# Patient Record
Sex: Male | Born: 1935 | Race: White | Hispanic: No | Marital: Married | Smoking: Former smoker
Health system: Southern US, Community
[De-identification: ages and names within clinical notes are randomized; demographics above are authoritative.]

## PROBLEM LIST (undated history)

## (undated) DIAGNOSIS — C801 Malignant (primary) neoplasm, unspecified: Secondary | ICD-10-CM

## (undated) DIAGNOSIS — M1990 Unspecified osteoarthritis, unspecified site: Secondary | ICD-10-CM

## (undated) DIAGNOSIS — N400 Enlarged prostate without lower urinary tract symptoms: Secondary | ICD-10-CM

## (undated) DIAGNOSIS — E119 Type 2 diabetes mellitus without complications: Secondary | ICD-10-CM

## (undated) HISTORY — PX: CHOLECYSTECTOMY: SHX55

---

## 2016-04-09 ENCOUNTER — Encounter (INDEPENDENT_AMBULATORY_CARE_PROVIDER_SITE_OTHER): Payer: Self-pay | Admitting: Ophthalmology

## 2016-05-10 ENCOUNTER — Encounter (INDEPENDENT_AMBULATORY_CARE_PROVIDER_SITE_OTHER): Payer: Self-pay | Admitting: Ophthalmology

## 2019-03-09 ENCOUNTER — Other Ambulatory Visit: Payer: Self-pay

## 2019-03-09 ENCOUNTER — Encounter (INDEPENDENT_AMBULATORY_CARE_PROVIDER_SITE_OTHER): Payer: Medicare HMO | Admitting: Ophthalmology

## 2019-03-09 DIAGNOSIS — H43813 Vitreous degeneration, bilateral: Secondary | ICD-10-CM

## 2019-03-09 DIAGNOSIS — E11311 Type 2 diabetes mellitus with unspecified diabetic retinopathy with macular edema: Secondary | ICD-10-CM

## 2019-03-09 DIAGNOSIS — E113291 Type 2 diabetes mellitus with mild nonproliferative diabetic retinopathy without macular edema, right eye: Secondary | ICD-10-CM

## 2019-03-09 DIAGNOSIS — E113312 Type 2 diabetes mellitus with moderate nonproliferative diabetic retinopathy with macular edema, left eye: Secondary | ICD-10-CM

## 2019-03-09 DIAGNOSIS — H353122 Nonexudative age-related macular degeneration, left eye, intermediate dry stage: Secondary | ICD-10-CM | POA: Diagnosis not present

## 2019-04-06 ENCOUNTER — Encounter (INDEPENDENT_AMBULATORY_CARE_PROVIDER_SITE_OTHER): Payer: Medicare HMO | Admitting: Ophthalmology

## 2019-04-06 DIAGNOSIS — H43813 Vitreous degeneration, bilateral: Secondary | ICD-10-CM

## 2019-04-06 DIAGNOSIS — E113312 Type 2 diabetes mellitus with moderate nonproliferative diabetic retinopathy with macular edema, left eye: Secondary | ICD-10-CM

## 2019-04-06 DIAGNOSIS — E113291 Type 2 diabetes mellitus with mild nonproliferative diabetic retinopathy without macular edema, right eye: Secondary | ICD-10-CM

## 2019-04-06 DIAGNOSIS — E11311 Type 2 diabetes mellitus with unspecified diabetic retinopathy with macular edema: Secondary | ICD-10-CM

## 2019-05-04 ENCOUNTER — Encounter (INDEPENDENT_AMBULATORY_CARE_PROVIDER_SITE_OTHER): Payer: Medicare HMO | Admitting: Ophthalmology

## 2019-05-10 ENCOUNTER — Emergency Department (HOSPITAL_COMMUNITY)
Admission: EM | Admit: 2019-05-10 | Discharge: 2019-05-10 | Disposition: A | Discharge status: Home / Self Care | Payer: Medicare HMO | Attending: Emergency Medicine | Admitting: Emergency Medicine

## 2019-05-10 ENCOUNTER — Emergency Department (HOSPITAL_COMMUNITY): Payer: Medicare HMO

## 2019-05-10 ENCOUNTER — Other Ambulatory Visit: Payer: Self-pay

## 2019-05-10 ENCOUNTER — Encounter (HOSPITAL_COMMUNITY): Payer: Self-pay | Admitting: *Deleted

## 2019-05-10 DIAGNOSIS — Z20822 Contact with and (suspected) exposure to covid-19: Secondary | ICD-10-CM | POA: Diagnosis not present

## 2019-05-10 DIAGNOSIS — R109 Unspecified abdominal pain: Secondary | ICD-10-CM | POA: Diagnosis not present

## 2019-05-10 DIAGNOSIS — M791 Myalgia, unspecified site: Secondary | ICD-10-CM | POA: Diagnosis present

## 2019-05-10 DIAGNOSIS — E119 Type 2 diabetes mellitus without complications: Secondary | ICD-10-CM | POA: Insufficient documentation

## 2019-05-10 DIAGNOSIS — Z87891 Personal history of nicotine dependence: Secondary | ICD-10-CM | POA: Diagnosis not present

## 2019-05-10 DIAGNOSIS — C799 Secondary malignant neoplasm of unspecified site: Secondary | ICD-10-CM

## 2019-05-10 DIAGNOSIS — R918 Other nonspecific abnormal finding of lung field: Secondary | ICD-10-CM | POA: Insufficient documentation

## 2019-05-10 DIAGNOSIS — I4891 Unspecified atrial fibrillation: Secondary | ICD-10-CM

## 2019-05-10 HISTORY — DX: Benign prostatic hyperplasia without lower urinary tract symptoms: N40.0

## 2019-05-10 HISTORY — DX: Unspecified osteoarthritis, unspecified site: M19.90

## 2019-05-10 HISTORY — DX: Type 2 diabetes mellitus without complications: E11.9

## 2019-05-10 LAB — COMPREHENSIVE METABOLIC PANEL
ALT: 12 U/L (ref 0–44)
AST: 17 U/L (ref 15–41)
Albumin: 3.4 g/dL — ABNORMAL LOW (ref 3.5–5.0)
Alkaline Phosphatase: 92 U/L (ref 38–126)
Anion gap: 10 (ref 5–15)
BUN: 8 mg/dL (ref 8–23)
CO2: 26 mmol/L (ref 22–32)
Calcium: 9.2 mg/dL (ref 8.9–10.3)
Chloride: 100 mmol/L (ref 98–111)
Creatinine, Ser: 0.49 mg/dL — ABNORMAL LOW (ref 0.61–1.24)
GFR calc Af Amer: >60 mL/min (ref >60)
GFR calc non Af Amer: >60 mL/min (ref >60)
Glucose, Bld: 130 mg/dL — ABNORMAL HIGH (ref 70–99)
Potassium: 3.3 mmol/L — ABNORMAL LOW (ref 3.5–5.1)
Sodium: 136 mmol/L (ref 135–145)
Total Bilirubin: 0.7 mg/dL (ref 0.3–1.2)
Total Protein: 7.3 g/dL (ref 6.5–8.1)

## 2019-05-10 LAB — URINALYSIS, ROUTINE W REFLEX MICROSCOPIC
Bilirubin Urine: NEGATIVE
Glucose, UA: NEGATIVE mg/dL
Hgb urine dipstick: NEGATIVE
Ketones, ur: NEGATIVE mg/dL
Leukocytes,Ua: NEGATIVE
Nitrite: NEGATIVE
Protein, ur: NEGATIVE mg/dL
Specific Gravity, Urine: 1.005 (ref 1.005–1.030)
pH: 6 (ref 5.0–8.0)

## 2019-05-10 LAB — CBC WITH DIFFERENTIAL/PLATELET
Abs Immature Granulocytes: 0.01 10*3/uL (ref 0.00–0.07)
Basophils Absolute: 0.1 10*3/uL (ref 0.0–0.1)
Basophils Relative: 1 %
Eosinophils Absolute: 0 10*3/uL (ref 0.0–0.5)
Eosinophils Relative: 1 %
HCT: 46.6 % (ref 39.0–52.0)
Hemoglobin: 14.8 g/dL (ref 13.0–17.0)
Immature Granulocytes: 0 %
Lymphocytes Relative: 13 %
Lymphs Abs: 1 10*3/uL (ref 0.7–4.0)
MCH: 28.6 pg (ref 26.0–34.0)
MCHC: 31.8 g/dL (ref 30.0–36.0)
MCV: 90.1 fL (ref 80.0–100.0)
Monocytes Absolute: 0.5 10*3/uL (ref 0.1–1.0)
Monocytes Relative: 6 %
Neutro Abs: 6.3 10*3/uL (ref 1.7–7.7)
Neutrophils Relative %: 79 %
Platelets: 420 10*3/uL — ABNORMAL HIGH (ref 150–400)
RBC: 5.17 MIL/uL (ref 4.22–5.81)
RDW: 14.1 % (ref 11.5–15.5)
WBC: 7.9 10*3/uL (ref 4.0–10.5)
nRBC: 0 % (ref 0.0–0.2)

## 2019-05-10 LAB — TSH: TSH: 0.445 u[IU]/mL (ref 0.350–4.500)

## 2019-05-10 LAB — MAGNESIUM: Magnesium: 1.6 mg/dL — ABNORMAL LOW (ref 1.7–2.4)

## 2019-05-10 LAB — RESPIRATORY PANEL BY RT PCR (FLU A&B, COVID)
Influenza A by PCR: NEGATIVE
Influenza B by PCR: NEGATIVE
SARS Coronavirus 2 by RT PCR: NEGATIVE

## 2019-05-10 LAB — C-REACTIVE PROTEIN: CRP: 2.5 mg/dL — ABNORMAL HIGH (ref <1.0)

## 2019-05-10 LAB — DIGOXIN LEVEL: Digoxin Level: <0.2 ng/mL — ABNORMAL LOW (ref 0.8–2.0)

## 2019-05-10 LAB — SEDIMENTATION RATE: Sed Rate: 38 mm/hr — ABNORMAL HIGH (ref 0–16)

## 2019-05-10 LAB — TROPONIN I (HIGH SENSITIVITY)
Troponin I (High Sensitivity): 5 ng/L (ref <18)
Troponin I (High Sensitivity): 4 ng/L (ref <18)

## 2019-05-10 LAB — CK: Total CK: 31 U/L — ABNORMAL LOW (ref 49–397)

## 2019-05-10 IMAGING — CT CT ABD-PELV W/ CM
2 of 5 series · 15 of 46 positions shown, 17 images · IV contrast (Omnipaque or Isovue)
Comparison: [DATE], [DATE]

CLINICAL DATA: Generalized body pain, abnormal chest x-ray

EXAM:
CT ABDOMEN AND PELVIS WITH CONTRAST
TECHNIQUE: Multidetector CT imaging of the abdomen and pelvis was performed
using the standard protocol following bolus administration of
intravenous contrast.
CONTRAST:  100mL OMNIPAQUE IOHEXOL 350 MG/ML SOLN

[Series 10: abd/pel w/ · axial · 0.80mm/px · z∈[+718,+1098]mm · 12 of 90 slices shown, 14 images]
[im 7/90  soft-tissue]
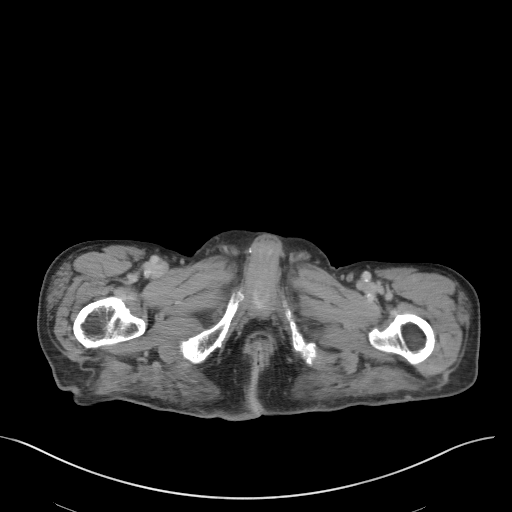
[im 7/90  bone]
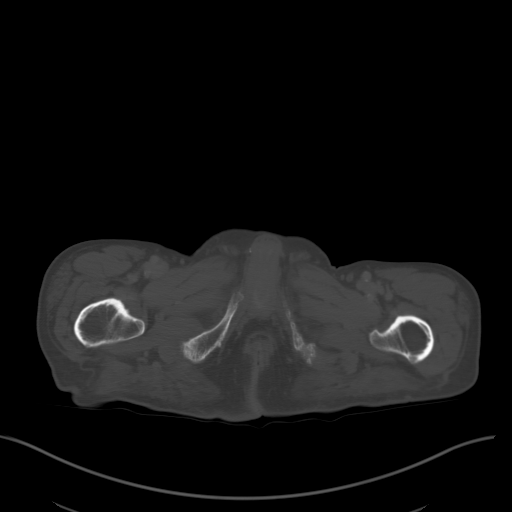
[im 14/90  soft-tissue]
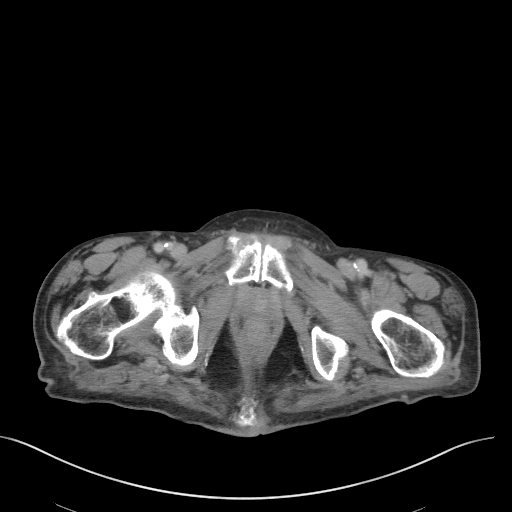
[im 21/90  soft-tissue]
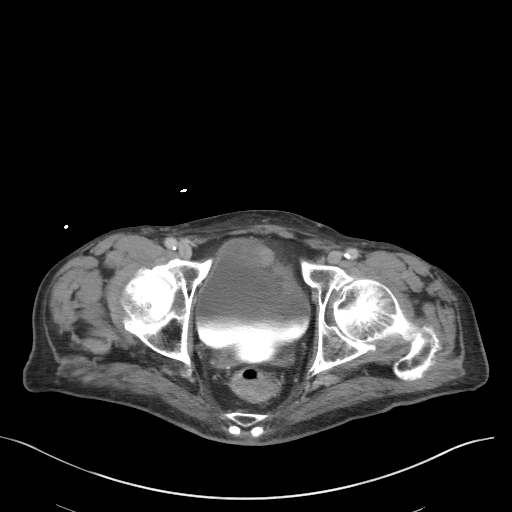
[im 28/90  soft-tissue]
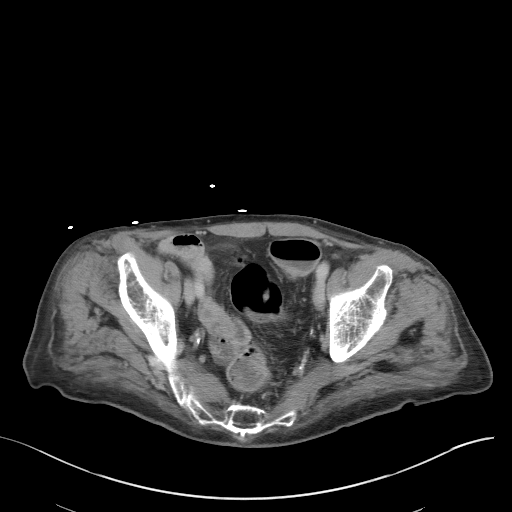
[im 35/90  soft-tissue]
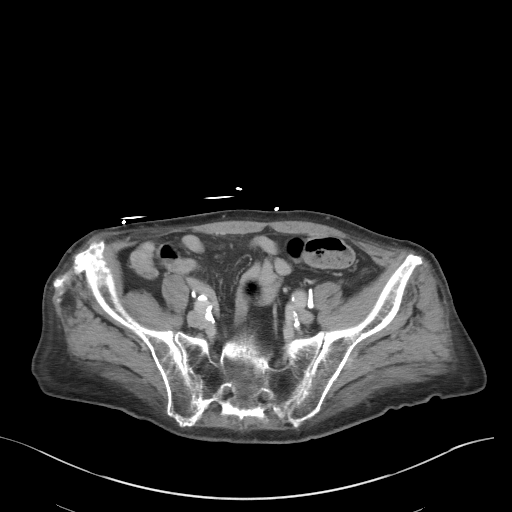
[im 42/90  soft-tissue]
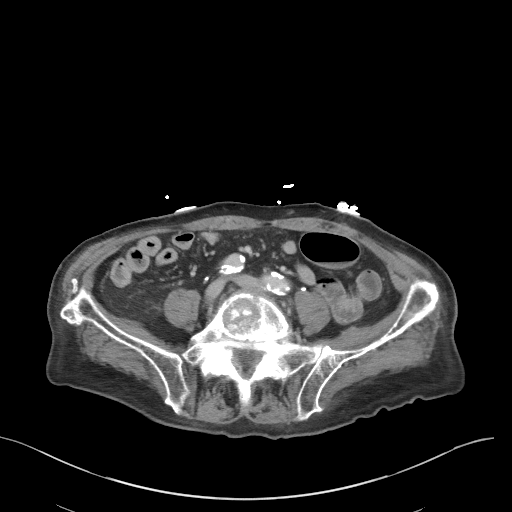
[im 48/90  soft-tissue]
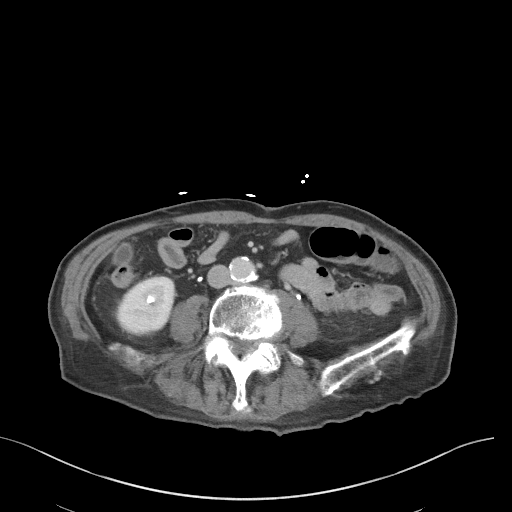
[im 55/90  soft-tissue]
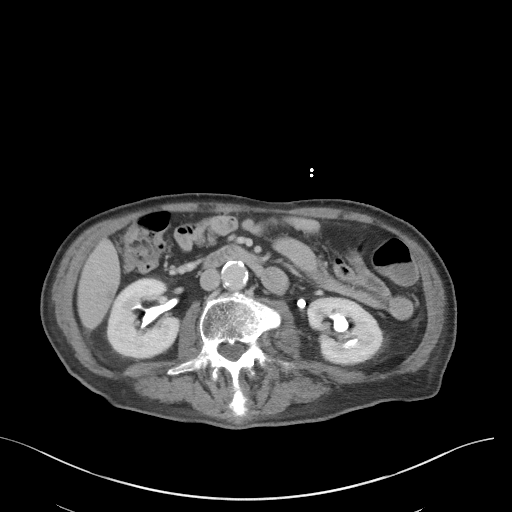
[im 62/90  soft-tissue]
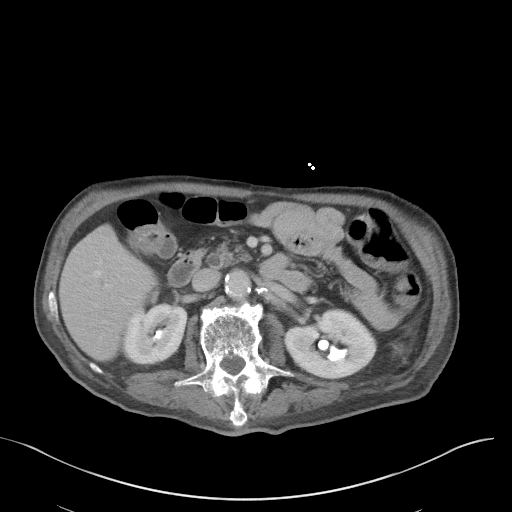
[im 62/90  bone]
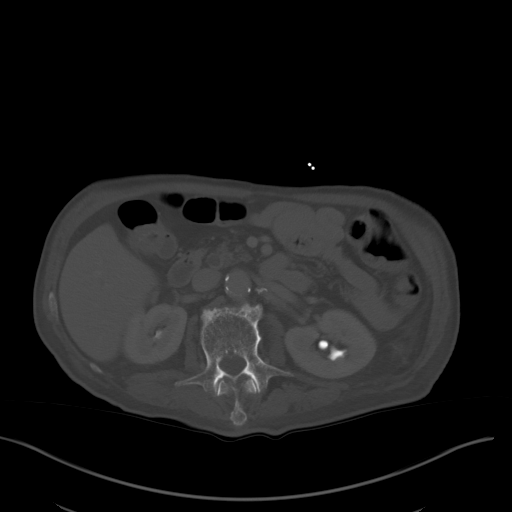
[im 69/90  soft-tissue]
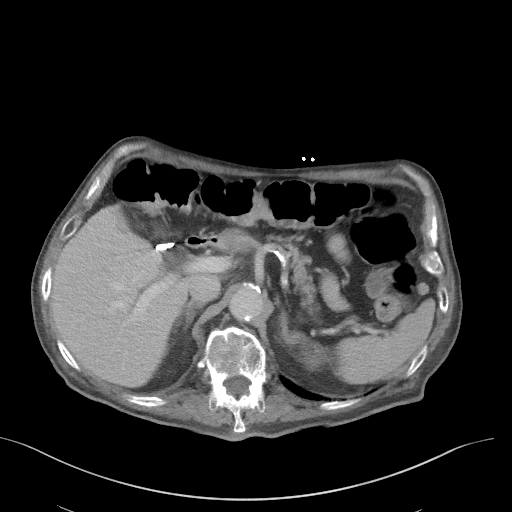
[im 76/90  soft-tissue]
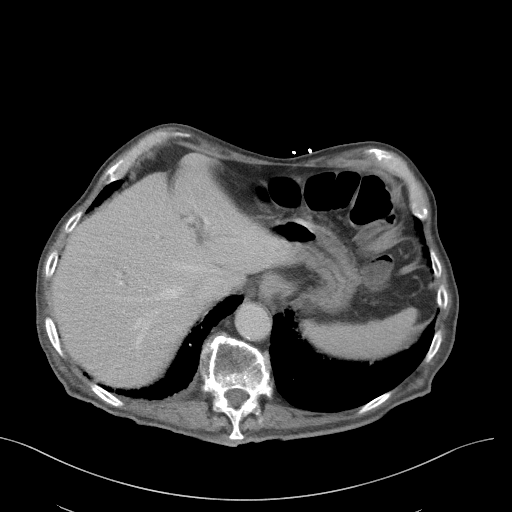
[im 83/90  soft-tissue]
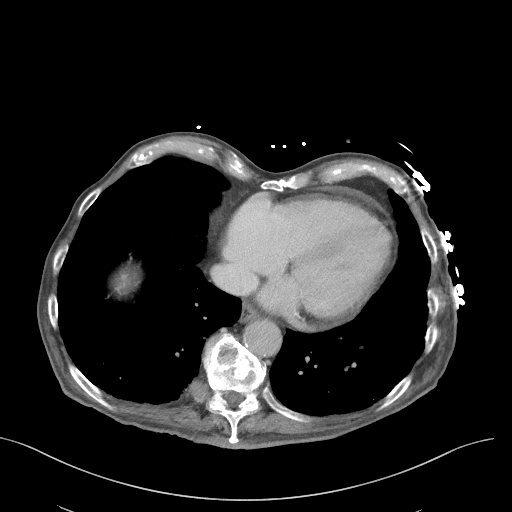

[Series 12: coronal · coronal · 0.75mm/px · 3 of 88 slices shown]
[im 30/88  soft-tissue]
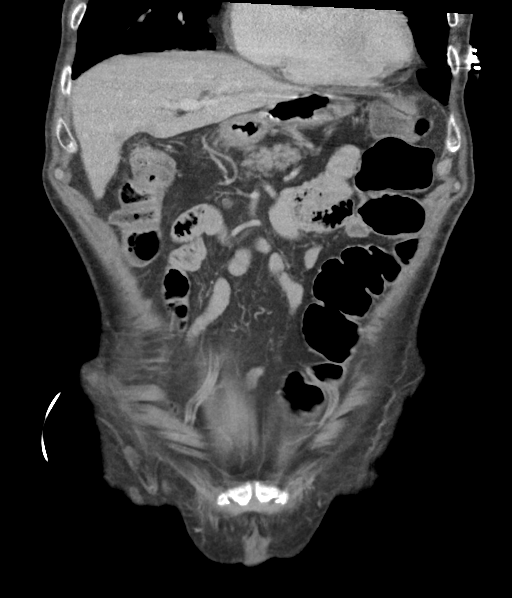
[im 39/88  soft-tissue]
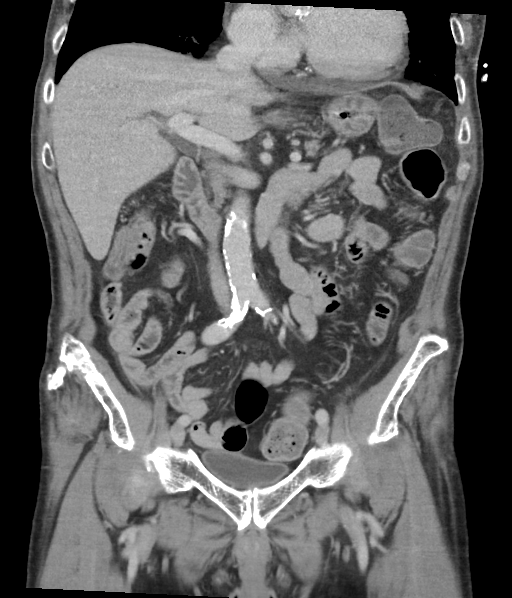
[im 49/88  soft-tissue]
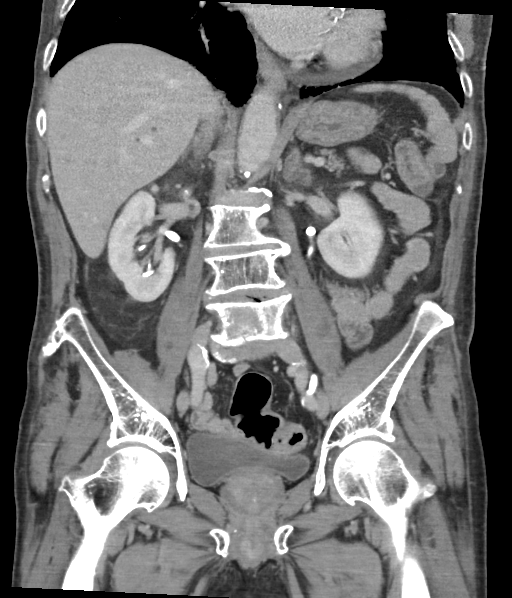

[15 of 46 positions shown; findings below may reference images not displayed]

FINDINGS: Lower chest: Please refer to findings within the CT chest report
describing bronchogenic malignancy and intrathoracic metastatic
disease.

Hepatobiliary: There are multiple liver hypodensities consistent
with metastatic disease, largest in the right lobe reference image
10 measuring 1.8 x 1.9 cm. Gallbladder surgically absent.

Pancreas: Unremarkable. No pancreatic ductal dilatation or
surrounding inflammatory changes.

Spleen: Normal in size without focal abnormality.

Adrenals/Urinary Tract: Left adrenal thickening measures 16 mm,
concerning for metastatic disease given chest CT findings. Right
adrenals unremarkable. The kidneys enhance normally and
symmetrically. Normal excretion of contrast into unremarkable
bilateral pelvocaliceal structures. Bladder is unremarkable.

Stomach/Bowel: No bowel obstruction or ileus. No inflammatory
changes.

Vascular/Lymphatic: Aortic atherosclerosis. No enlarged abdominal or
pelvic lymph nodes.

Reproductive: Prostate is unremarkable.

Other: No abdominal wall hernia or abnormality. No abdominopelvic
ascites.

Musculoskeletal: Lytic metastatic lesions are seen within the right
superior aspect of the L1 vertebral body as well as the inferior
aspect of the L5 vertebral body. I do not see any pathologic
fracture. Reconstructed images demonstrate no additional findings.
IMPRESSION: 1. Metastatic disease within the liver, left adrenal gland, and
lumbar spine. Please refer to CT chest report describing primary
lung cancer and intrathoracic metastases.

## 2019-05-10 IMAGING — DX DG CHEST 1V PORT
1 series · 1 of 1 positions shown · non-contrast
Comparison: None.

CLINICAL DATA: Generalized body pain and dyspnea

EXAM:
PORTABLE CHEST 1 VIEW

[chest ap]
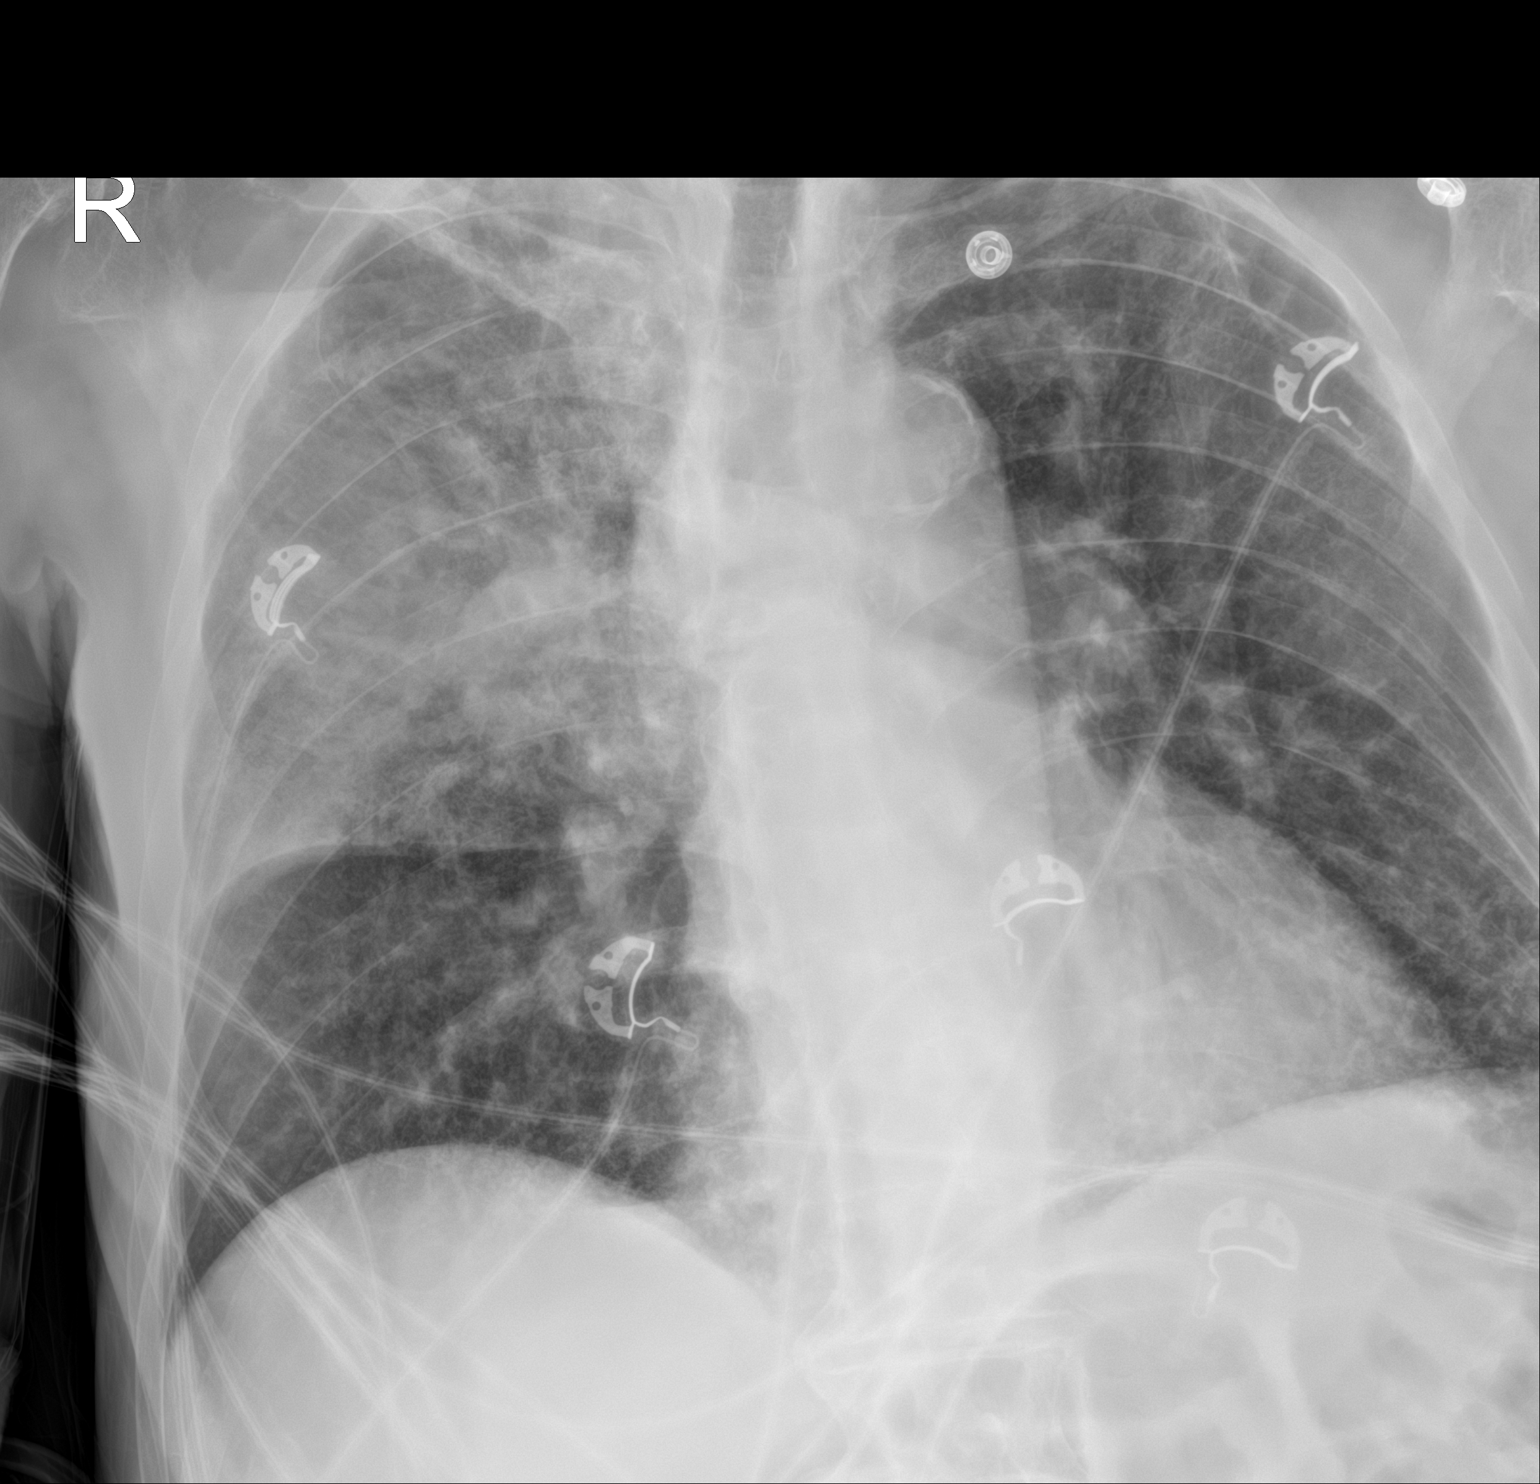

[1 of 1 positions shown; findings below may reference images not displayed]

FINDINGS: Cardiac shadows within normal limits. Aortic calcifications are
seen. Vascular congestion is noted as well as diffuse right upper
lobe infiltrate consistent with acute pneumonia. No bony abnormality
is noted.
IMPRESSION: Diffuse right upper lobe infiltrate. Mild vascular congestion is
noted as well.

## 2019-05-10 IMAGING — DX DG SHOULDER 2+V*R*
2 series · 2 of 2 positions shown · non-contrast
Comparison: None.

CLINICAL DATA: Right shoulder pain

EXAM:
RIGHT SHOULDER - 2+ VIEW

[shoulder ap]
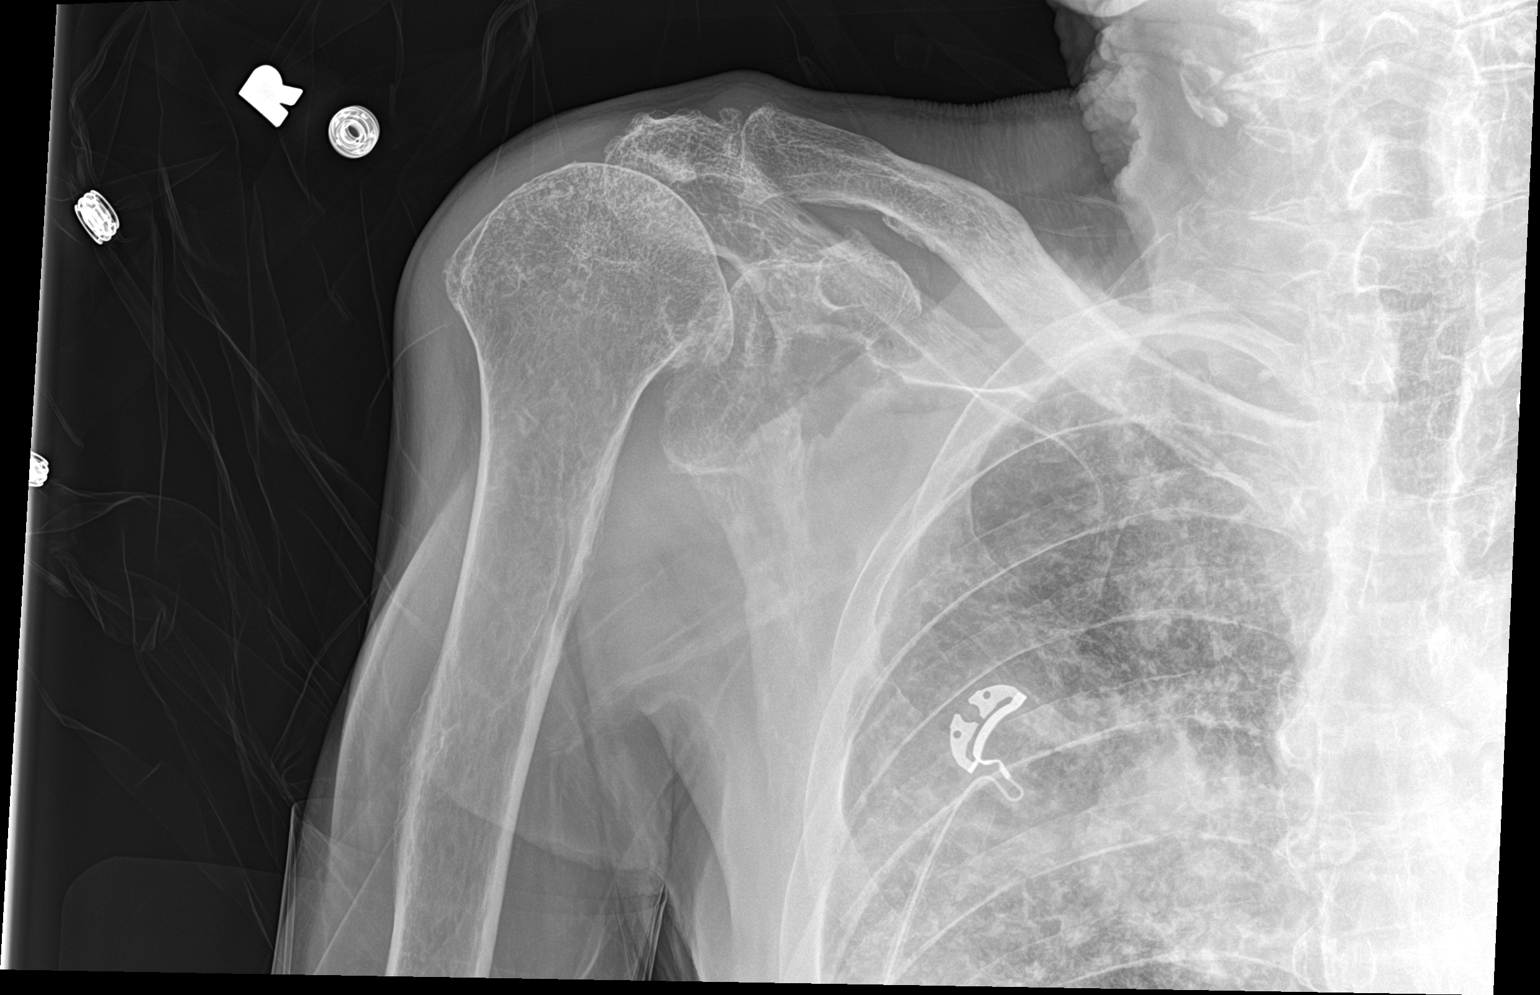

[shoulder obl]
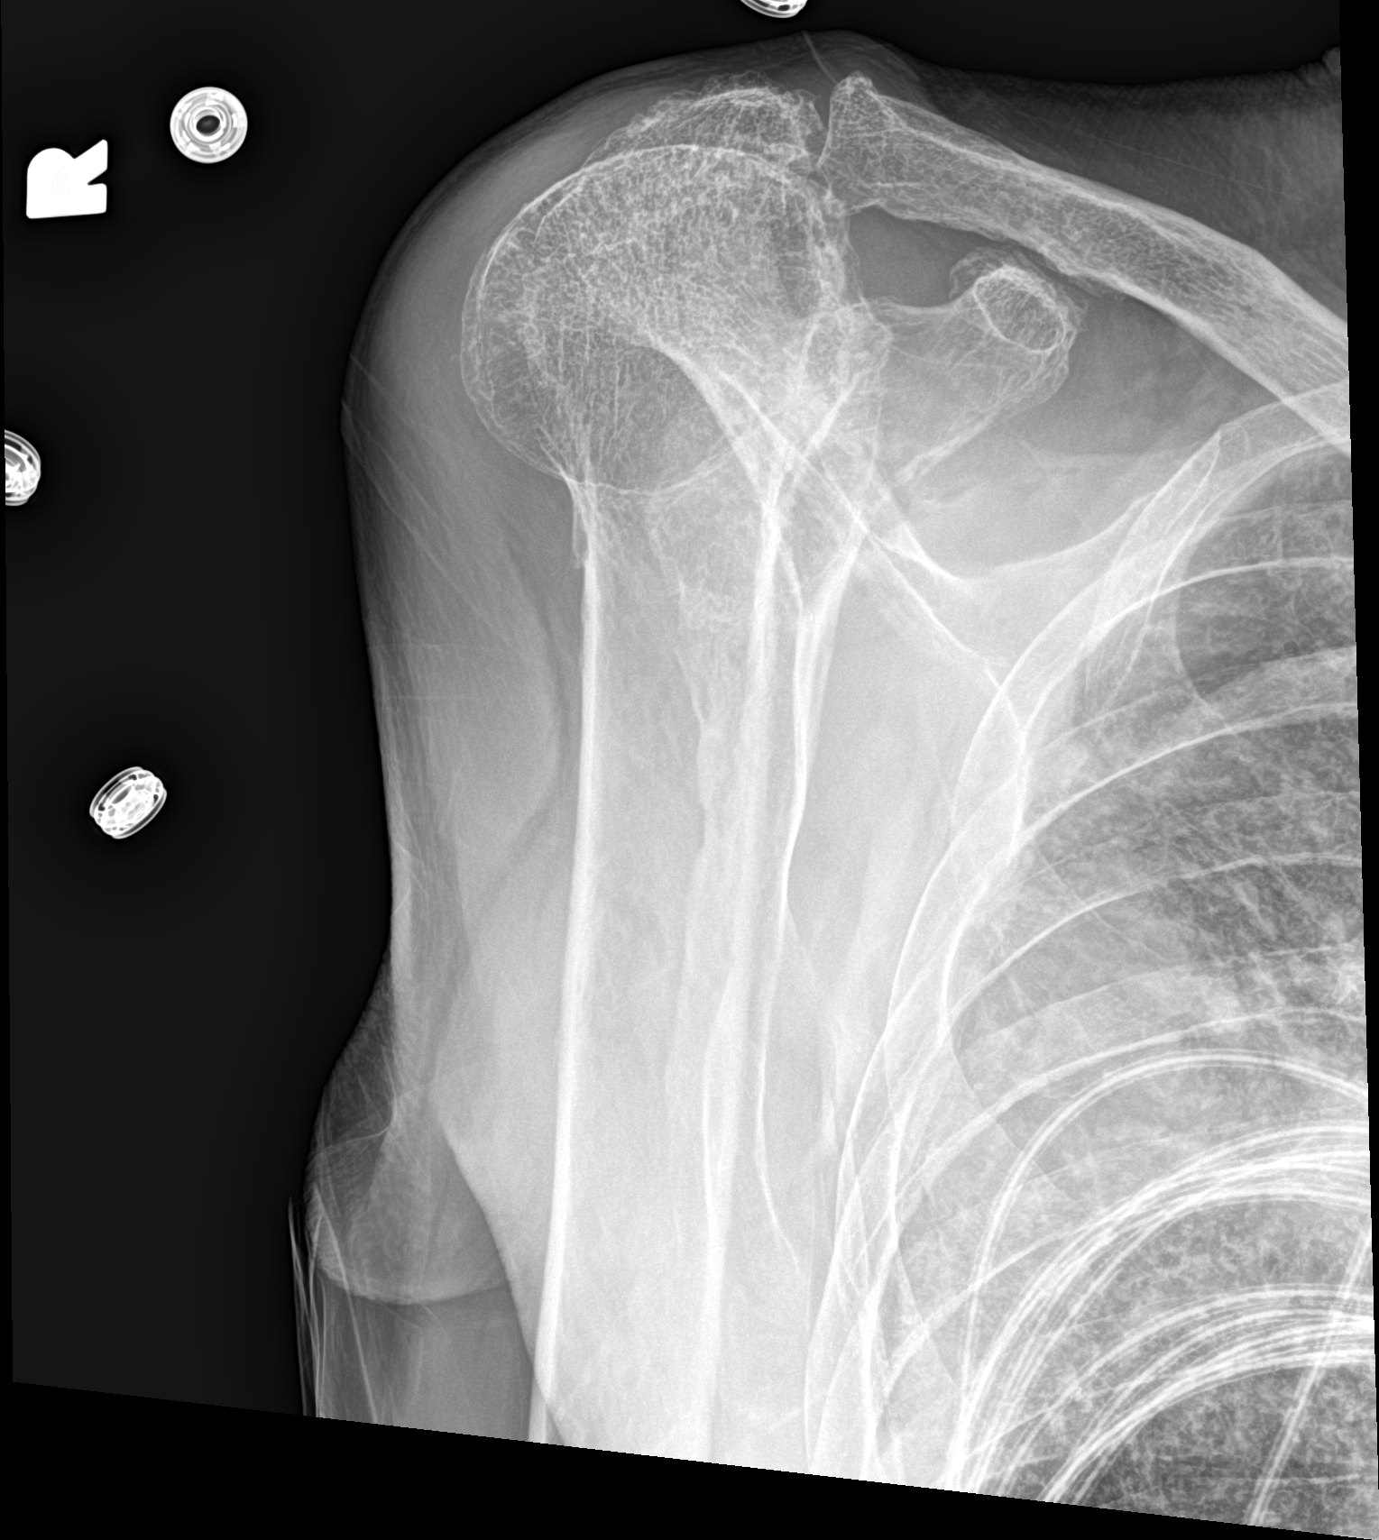

[2 of 2 positions shown; findings below may reference images not displayed]

FINDINGS: Degenerative changes are noted in the glenohumeral joint and
acromioclavicular joint. Humeral head is somewhat high-riding likely
related to chronic rotator cuff injury. No definitive fracture is
seen.
IMPRESSION: Degenerative change without acute abnormality.

## 2019-05-10 IMAGING — CT CT ANGIO CHEST
2 of 6 series · 17 of 46 positions shown · IV contrast (Omnipaque or Isovue)
Comparison: [DATE]

CLINICAL DATA: Short of breath, generalized body pain

EXAM:
CT ANGIOGRAPHY CHEST WITH CONTRAST
TECHNIQUE: Multidetector CT imaging of the chest was performed using the
standard protocol during bolus administration of intravenous
contrast. Multiplanar CT image reconstructions and MIPs were
obtained to evaluate the vascular anatomy.
CONTRAST:  100mL OMNIPAQUE IOHEXOL 350 MG/ML SOLN

[Series 3: pe axialthins · axial · 0.82mm/px · z∈[+1031,+1323]mm · 14 of 320 slices shown]
[im 14/320  lung]
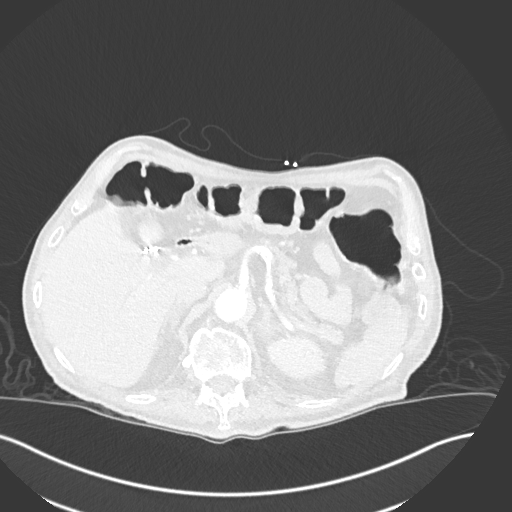
[im 42/320  soft-tissue]
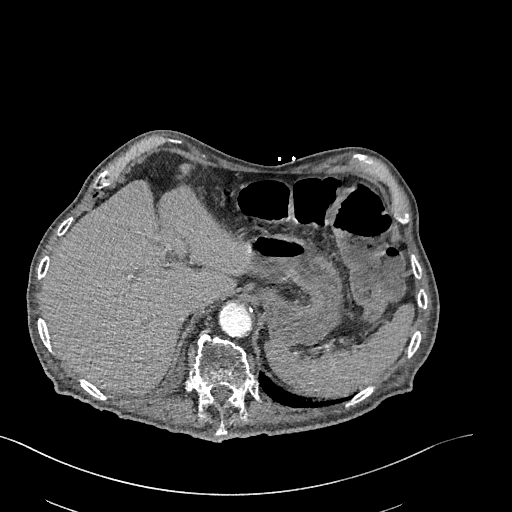
[im 56/320  lung]
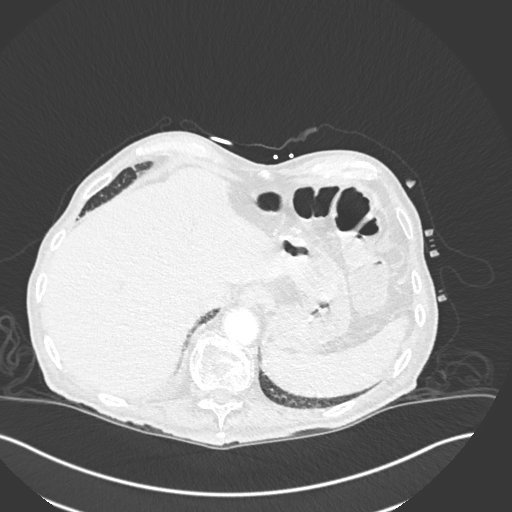
[im 84/320  soft-tissue]
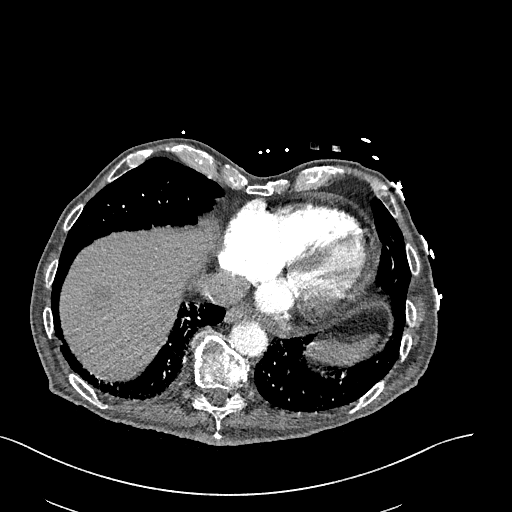
[im 111/320  lung]
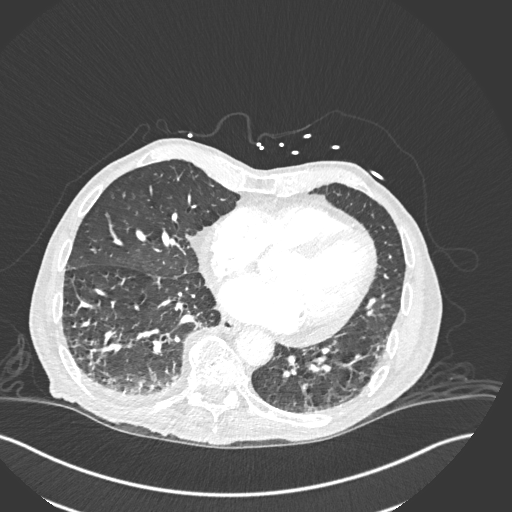
[im 125/320  soft-tissue]
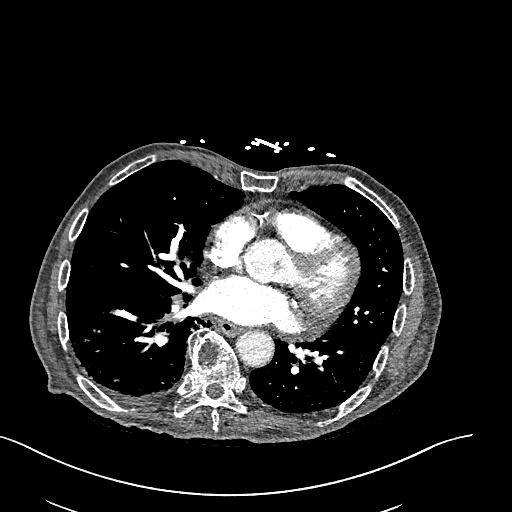
[im 153/320  lung]
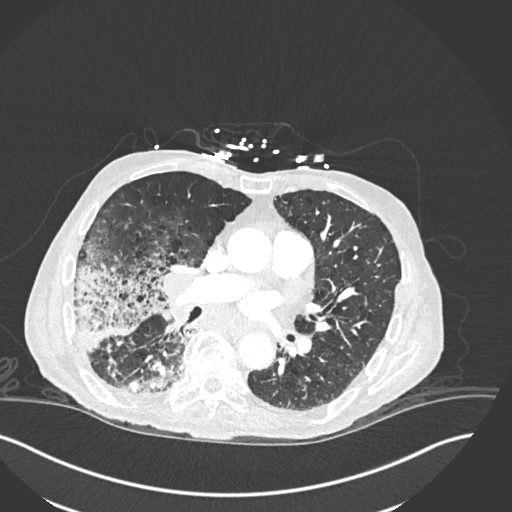
[im 167/320  soft-tissue]
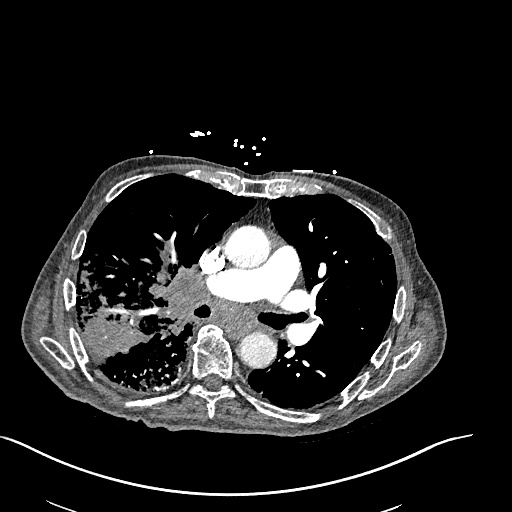
[im 195/320  lung]
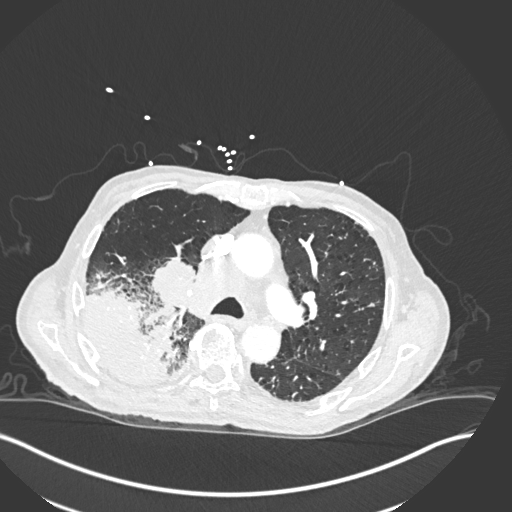
[im 209/320  soft-tissue]
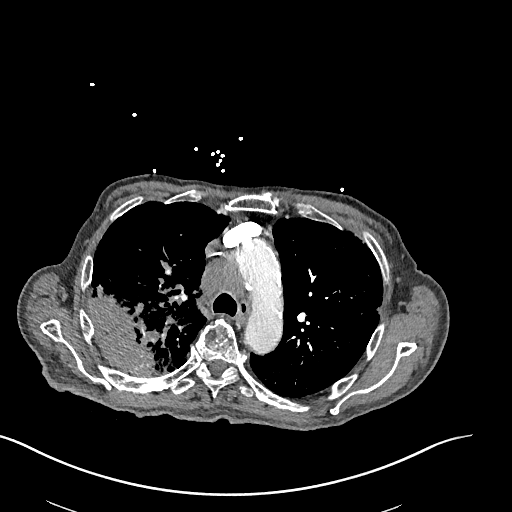
[im 236/320  lung]
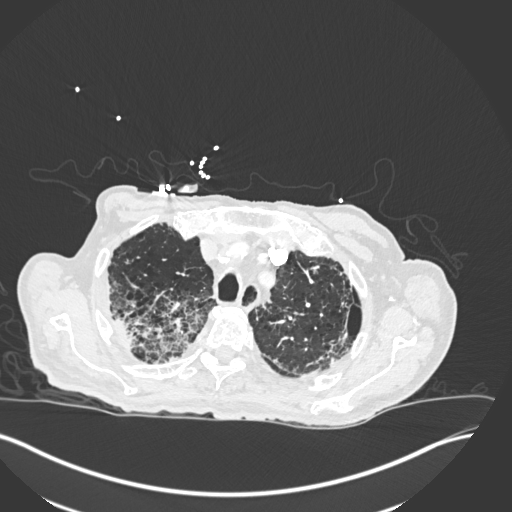
[im 264/320  soft-tissue]
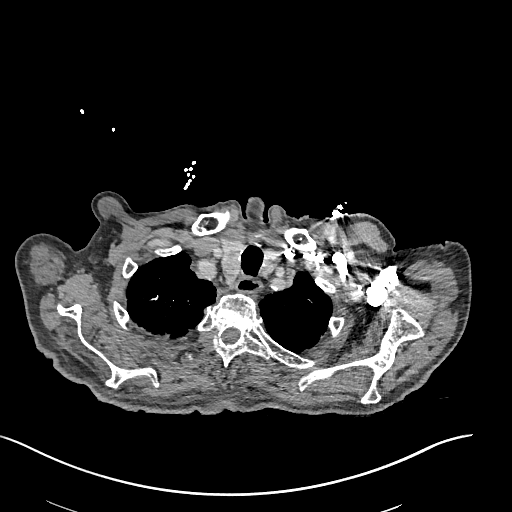
[im 278/320  lung]
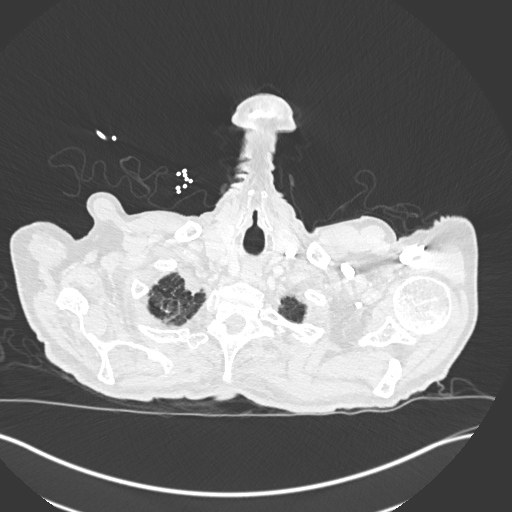
[im 306/320  soft-tissue]
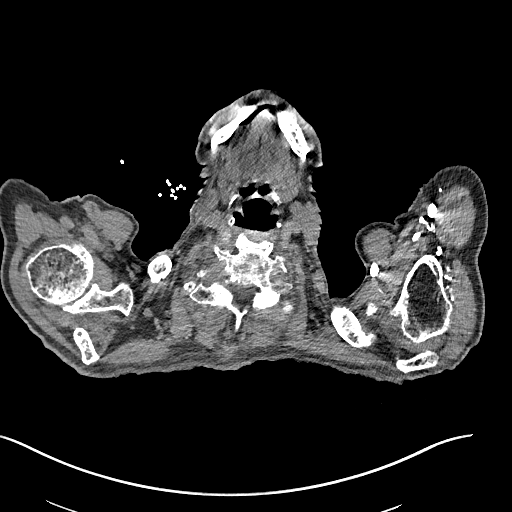

[Series 5: cor soft · coronal · 0.63mm/px · 3 of 142 slices shown]
[im 36/142  soft-tissue]
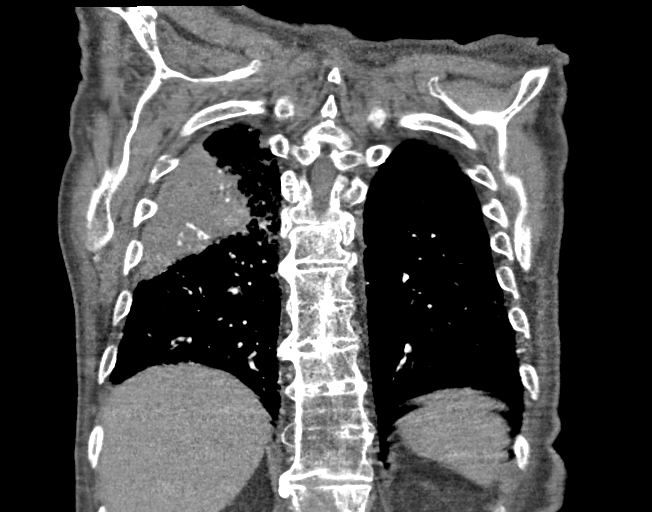
[im 71/142  soft-tissue]
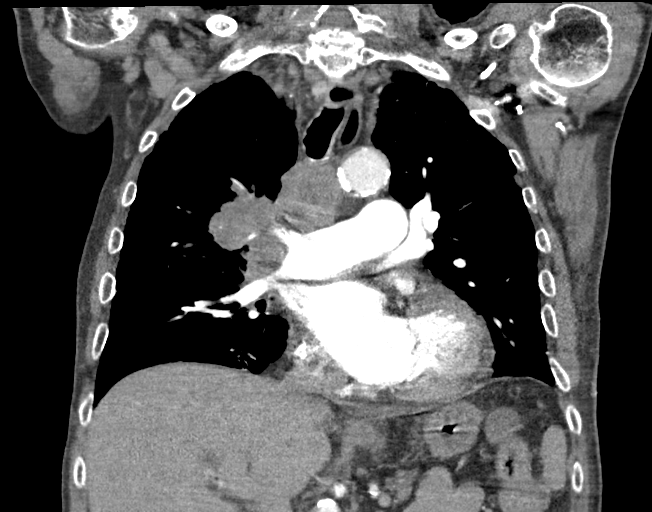
[im 106/142  soft-tissue]
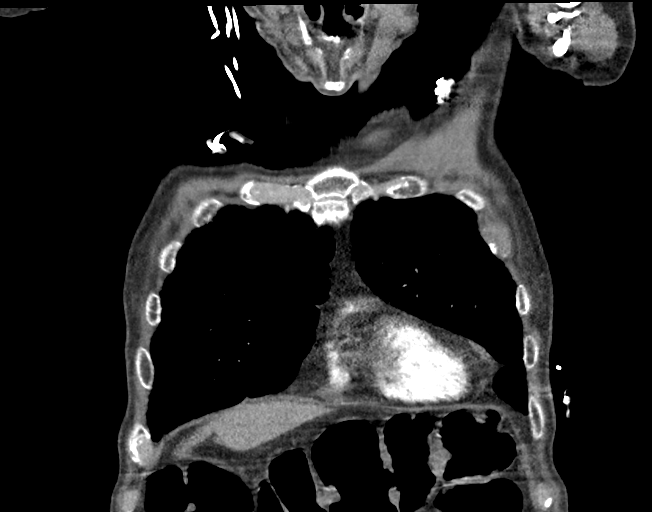

[17 of 46 positions shown; findings below may reference images not displayed]

FINDINGS: Cardiovascular: This is a technically adequate evaluation of the
pulmonary vasculature. There are no filling defects or pulmonary
emboli.

There is trace pericardial effusion. The heart is not enlarged. Mild
to moderate atherosclerosis of the aorta and coronary vessels.

Mediastinum/Nodes: There is diffuse lymphadenopathy throughout the
mediastinum and hila. Precarinal adenopathy measures up to 3.1 cm on
image 43, subcarinal adenopathy measures up to 2.5 cm reference
image 54, enlarged right hilar mass/adenopathy measures up to 4.1 cm
reference image 47. Overall, the findings are consistent with
bronchogenic right upper lobe neoplasm and mediastinal and
ipsilateral hilar metastatic adenopathy.

Lungs/Pleura: There is masslike consolidation within the right upper
lobe, measuring 7.5 x 7.0 cm reference image 46. It is unclear
whether this reflects underlying neoplasm or is postobstructive in
nature, as there is abrupt cut off of the right upper lobe bronchus
on image 47 adjacent to the right hilar mass described previously.
PET-CT is recommended for further evaluation.

Diffuse interlobular septal thickening throughout the right upper
lobe is suspicious for lymphangitic spread of disease.

Bibasilar interstitial prominence likely reflects scarring and
fibrosis. No effusion or pneumothorax. There is severe background
emphysema.

Upper Abdomen: Metastatic disease is seen within the liver. Please
refer to corresponding CT abdomen report.

Musculoskeletal: Lytic metastatic lesions are seen within the left
anterior fourth rib, left anterolateral fifth rib and right
posterior eleventh rib at the costovertebral junction. No pathologic
fractures.

There is relative lucency within the vertebral bodies from T8
through T12, metastatic disease not excluded. Lytic lesion within
the superior aspect of the L1 vertebral body most likely reflects
additional metastatic focus.

Review of the MIP images confirms the above findings.
IMPRESSION: 1. Masslike consolidation in the right upper lobe as well as a right
hilar mass resulting in right upper lobe bronchial obstruction,
consistent with bronchogenic malignancy.
2. Massive mediastinal and right hilar adenopathy consistent with
nodal metastases.
3. Multifocal bony metastatic disease as above.
4. Interstitial prominence throughout the right upper lobe highly
suspicious for lymphangitic spread of disease.
5. No evidence of pulmonary embolus.
6. Aortic Atherosclerosis ([YH]-[YH]) and Emphysema ([YH]-[YH]).

## 2019-05-10 MED ORDER — OXYCODONE-ACETAMINOPHEN 5-325 MG PO TABS: 1.0000 | ORAL_TABLET | Freq: Four times a day (QID) | ORAL | 0 refills | Status: DC | PRN

## 2019-05-10 MED ORDER — LEVOFLOXACIN IN D5W 750 MG/150ML IV SOLN: 750.0000 mg | Freq: Once | INTRAVENOUS | Status: DC

## 2019-05-10 MED ORDER — FENTANYL CITRATE (PF) 100 MCG/2ML IJ SOLN
50.0000 ug | Freq: Once | INTRAMUSCULAR | Status: AC
  Administered 2019-05-10: 14:00:00 50 ug via INTRAVENOUS
  Filled 2019-05-10: qty 2

## 2019-05-10 MED ORDER — FENTANYL CITRATE (PF) 100 MCG/2ML IJ SOLN
50.0000 ug | Freq: Once | INTRAMUSCULAR | Status: AC
  Administered 2019-05-10: 17:00:00 50 ug via INTRAVENOUS
  Filled 2019-05-10: qty 2

## 2019-05-10 MED ORDER — IOHEXOL 350 MG/ML SOLN
100.0000 mL | Freq: Once | INTRAVENOUS | Status: AC | PRN
  Administered 2019-05-10: 15:00:00 100 mL via INTRAVENOUS

## 2019-05-10 MED ORDER — POTASSIUM CHLORIDE CRYS ER 20 MEQ PO TBCR
40.0000 meq | EXTENDED_RELEASE_TABLET | Freq: Once | ORAL | Status: AC
  Administered 2019-05-10: 17:00:00 40 meq via ORAL
  Filled 2019-05-10: qty 2

## 2019-05-10 NOTE — ED Provider Notes (Signed)
[facility] EMERGENCY DEPARTMENT Provider Note   CSN: [id number] Arrival date & time: 05/10/19  1118     History Chief Complaint  Patient presents with  . Generalized Body Aches    Zamere Pasternak is a 86 y.o. male with a history of diabetes mellitus, paroxysmal afib on digoxin & BPH who presents to the ED with complaints of generalized body aches. Patient states that he has been having pain to his entire body in variable locations for the past 3-4 weeks. He states that pain is a soreness, it began in his knees then seemed to move to his back and now is more so in his shoulders (R>L), but remains overall generalized. Worse with activity, no alleviating factors. He takes hydrocodone TID without much change, he contacted his doctor via video visit 04/28/19 and was prescribed celebrex which he states is also not helping. He feels generally weak. His pain & weakness is starting to limit his ADLs. He walks with a cane and has needed help from his wife doing things like getting off of the commode which is atypical for him. He states he becomes short of breath with exertion and does have a productive cough with brown phlegm sputum. His chest hurts sometimes and feels sore but this is similar to the rest of his achiness and is not particularly exertional. Reports intermittent abdominal pain as well as loose stools since starting celebrex, no melena/hematochezia/bright red blood per rectum. Denies fever, chills, nausea, vomiting, hematuria, dysuria, or syncope. He has had poor appetite/PO intake over the past several months and has lost about 40 pounds over the past 1 year. History provided by patient & supplement by his wife via telephone.    HPI     Past Medical History:  Diagnosis Date  . Arthritis   . BPH (benign prostatic hyperplasia)   . Diabetes mellitus without complication ([place])     There are no problems to display for this patient.   Past Surgical History:  Procedure Laterality Date    . CHOLECYSTECTOMY         No family history on file.  Social History   Tobacco Use  . Smoking status: Former Research scientist (life sciences)  . Smokeless tobacco: Current User    Types: Chew  Substance Use Topics  . Alcohol use: Never  . Drug use: Never    Home Medications Prior to Admission medications   Not on File    Allergies    Amoxicillin and Codeine  Review of Systems   Review of Systems  Constitutional: Positive for appetite change, fatigue and unexpected weight change. Negative for chills and fever.  Respiratory: Positive for cough and shortness of breath.   Cardiovascular: Positive for chest pain.  Gastrointestinal: Positive for abdominal pain and diarrhea. Negative for anal bleeding, blood in stool, constipation, nausea and vomiting.  Genitourinary: Negative for dysuria.  Musculoskeletal: Positive for arthralgias, back pain and myalgias.  Neurological: Positive for weakness. Negative for syncope.  All other systems reviewed and are negative.   Physical Exam Updated Vital Signs BP 118/72   Pulse 73   Temp 98.4 F (36.9 C) (Oral)   Resp 16   Ht 5\' 11"  (1.803 m)   Wt 65.8 kg   SpO2 94%   BMI 20.22 kg/m   Physical Exam Vitals and nursing note reviewed.  Constitutional:      General: He is not in acute distress.    Appearance: He is well-developed. He is not toxic-appearing.  HENT:  Head: Normocephalic and atraumatic.     Mouth/Throat:     Mouth: Mucous membranes are dry.  Eyes:     General:        Right eye: No discharge.        Left eye: No discharge.     Conjunctiva/sclera: Conjunctivae normal.     Pupils: Pupils are equal, round, and reactive to light.  Cardiovascular:     Rate and Rhythm: Tachycardia present. Rhythm irregularly irregular.     Pulses:          Radial pulses are 2+ on the right side and 2+ on the left side.       Posterior tibial pulses are 2+ on the right side and 2+ on the left side.  Pulmonary:     Effort: Pulmonary effort is normal. No  respiratory distress.     Breath sounds: Normal breath sounds. No wheezing, rhonchi or rales.  Abdominal:     General: There is no distension.     Palpations: Abdomen is soft.     Tenderness: There is abdominal tenderness. There is no rebound.  Musculoskeletal:     Cervical back: Neck supple.     Right lower leg: No edema.     Left lower leg: No edema.     Comments: No significant erythema, warmth, open wounds, or swelling. Upper extremities patient has intact active range of motion throughout the left upper extremity as well as throughout the right upper extremity with the exception of the right shoulder.  Significant limitation in right shoulder active range of motion.  Able to passively range.  Patient is tender palpation over the bilateral shoulders right greater than left.  Otherwise no focal tenderness.  Compartments are soft. Back: No point/focal vertebral tenderness or palpable step-off Lower extremities: Intact active range of motion throughout, without point/focal tenderness.  Compartments are soft.  Skin:    General: Skin is warm and dry.     Findings: No rash.  Neurological:     Mental Status: He is alert.     Comments: Clear speech.  Sensation grossly intact bilateral upper and lower extremities.  5 out of 5 symmetric grip strength.  5-5 strength with plantar dorsiflexion bilaterally.  Able to lift bilateral lower extremities off of the bed without difficulty.  Psychiatric:        Behavior: Behavior normal.     ED Results / Procedures / Treatments   Labs (all labs ordered are listed, but only abnormal results are displayed) Labs Reviewed  COMPREHENSIVE METABOLIC PANEL - Abnormal; Notable for the following components:      Result Value   Potassium 3.3 (*)    Glucose, Bld 130 (*)    Creatinine, Ser 0.49 (*)    Albumin 3.4 (*)    All other components within normal limits  CBC WITH DIFFERENTIAL/PLATELET - Abnormal; Notable for the following components:   Platelets 420  (*)    All other components within normal limits  CK - Abnormal; Notable for the following components:   Total CK 31 (*)    All other components within normal limits  MAGNESIUM - Abnormal; Notable for the following components:   Magnesium 1.6 (*)    All other components within normal limits  SEDIMENTATION RATE - Abnormal; Notable for the following components:   Sed Rate 38 (*)    All other components within normal limits  C-REACTIVE PROTEIN - Abnormal; Notable for the following components:   CRP 2.5 (*)  All other components within normal limits  DIGOXIN LEVEL - Abnormal; Notable for the following components:   Digoxin Level <0.2 (*)    All other components within normal limits  RESPIRATORY PANEL BY RT PCR (FLU A&B, COVID)  URINALYSIS, ROUTINE W REFLEX MICROSCOPIC  TSH  TROPONIN I (HIGH SENSITIVITY)  TROPONIN I (HIGH SENSITIVITY)    EKG EKG Interpretation  Date/Time:  Sunday May 10 2019 12:22:16 EST Ventricular Rate:  125 PR Interval:    QRS Duration: 82 QT Interval:  348 QTC Calculation: 442 R Axis:   -44 Text Interpretation: Atrial fibrillation Paired ventricular premature complexes Inferior infarct, old No STEMI Confirmed by Nanda Quinton [phone]) on 05/10/2019 12:29:13 PM   Radiology DG Shoulder Right  Result Date: 05/10/2019 CLINICAL DATA:  Right shoulder pain EXAM: RIGHT SHOULDER - 2+ VIEW COMPARISON:  None. FINDINGS: Degenerative changes are noted in the glenohumeral joint and acromioclavicular joint. Humeral head is somewhat high-riding likely related to chronic rotator cuff injury. No definitive fracture is seen. IMPRESSION: Degenerative change without acute abnormality. Electronically Signed   By: Inez Catalina M.D.   On: 05/10/2019 13:24   CT Angio Chest PE W/Cm &/Or Wo Cm  Result Date: 05/10/2019 CLINICAL DATA:  Short of breath, generalized body pain EXAM: CT ANGIOGRAPHY CHEST WITH CONTRAST TECHNIQUE: Multidetector CT imaging of the chest was performed using  the standard protocol during bolus administration of intravenous contrast. Multiplanar CT image reconstructions and MIPs were obtained to evaluate the vascular anatomy. CONTRAST:  127mL OMNIPAQUE IOHEXOL 350 MG/ML SOLN COMPARISON:  05/10/2019 FINDINGS: Cardiovascular: This is a technically adequate evaluation of the pulmonary vasculature. There are no filling defects or pulmonary emboli. There is trace pericardial effusion. The heart is not enlarged. Mild to moderate atherosclerosis of the aorta and coronary vessels. Mediastinum/Nodes: There is diffuse lymphadenopathy throughout the mediastinum and hila. Precarinal adenopathy measures up to 3.1 cm on image 43, subcarinal adenopathy measures up to 2.5 cm reference image 54, enlarged right hilar mass/adenopathy measures up to 4.1 cm reference image 47. Overall, the findings are consistent with bronchogenic right upper lobe neoplasm and mediastinal and ipsilateral hilar metastatic adenopathy. Lungs/Pleura: There is masslike consolidation within the right upper lobe, measuring 7.5 x 7.0 cm reference image 46. It is unclear whether this reflects underlying neoplasm or is postobstructive in nature, as there is abrupt cut off of the right upper lobe bronchus on image 47 adjacent to the right hilar mass described previously. PET-CT is recommended for further evaluation. Diffuse interlobular septal thickening throughout the right upper lobe is suspicious for lymphangitic spread of disease. Bibasilar interstitial prominence likely reflects scarring and fibrosis. No effusion or pneumothorax. There is severe background emphysema. Upper Abdomen: Metastatic disease is seen within the liver. Please refer to corresponding CT abdomen report. Musculoskeletal: Lytic metastatic lesions are seen within the left anterior fourth rib, left anterolateral fifth rib and right posterior eleventh rib at the costovertebral junction. No pathologic fractures. There is relative lucency within the  vertebral bodies from T8 through T12, metastatic disease not excluded. Lytic lesion within the superior aspect of the L1 vertebral body most likely reflects additional metastatic focus. Review of the MIP images confirms the above findings. IMPRESSION: 1. Masslike consolidation in the right upper lobe as well as a right hilar mass resulting in right upper lobe bronchial obstruction, consistent with bronchogenic malignancy. 2. Massive mediastinal and right hilar adenopathy consistent with nodal metastases. 3. Multifocal bony metastatic disease as above. 4. Interstitial prominence throughout the right upper lobe highly  suspicious for lymphangitic spread of disease. 5. No evidence of pulmonary embolus. 6. Aortic Atherosclerosis (ICD10-I70.0) and Emphysema (ICD10-J43.9). Electronically Signed   By: Randa Ngo M.D.   On: 05/10/2019 15:49   CT Abdomen Pelvis W Contrast  Result Date: 05/10/2019 CLINICAL DATA:  Generalized body pain, abnormal chest x-ray EXAM: CT ABDOMEN AND PELVIS WITH CONTRAST TECHNIQUE: Multidetector CT imaging of the abdomen and pelvis was performed using the standard protocol following bolus administration of intravenous contrast. CONTRAST:  141mL OMNIPAQUE IOHEXOL 350 MG/ML SOLN COMPARISON:  05/10/2019, 12/17/2018 FINDINGS: Lower chest: Please refer to findings within the CT chest report describing bronchogenic malignancy and intrathoracic metastatic disease. Hepatobiliary: There are multiple liver hypodensities consistent with metastatic disease, largest in the right lobe reference image 10 measuring 1.8 x 1.9 cm. Gallbladder surgically absent. Pancreas: Unremarkable. No pancreatic ductal dilatation or surrounding inflammatory changes. Spleen: Normal in size without focal abnormality. Adrenals/Urinary Tract: Left adrenal thickening measures 16 mm, concerning for metastatic disease given chest CT findings. Right adrenals unremarkable. The kidneys enhance normally and symmetrically. Normal  excretion of contrast into unremarkable bilateral pelvocaliceal structures. Bladder is unremarkable. Stomach/Bowel: No bowel obstruction or ileus. No inflammatory changes. Vascular/Lymphatic: Aortic atherosclerosis. No enlarged abdominal or pelvic lymph nodes. Reproductive: Prostate is unremarkable. Other: No abdominal wall hernia or abnormality. No abdominopelvic ascites. Musculoskeletal: Lytic metastatic lesions are seen within the right superior aspect of the L1 vertebral body as well as the inferior aspect of the L5 vertebral body. I do not see any pathologic fracture. Reconstructed images demonstrate no additional findings. IMPRESSION: 1. Metastatic disease within the liver, left adrenal gland, and lumbar spine. Please refer to CT chest report describing primary lung cancer and intrathoracic metastases. Electronically Signed   By: Randa Ngo M.D.   On: 05/10/2019 15:53   DG Chest Portable 1 View  Result Date: 05/10/2019 CLINICAL DATA:  Generalized body pain and dyspnea EXAM: PORTABLE CHEST 1 VIEW COMPARISON:  None. FINDINGS: Cardiac shadows within normal limits. Aortic calcifications are seen. Vascular congestion is noted as well as diffuse right upper lobe infiltrate consistent with acute pneumonia. No bony abnormality is noted. IMPRESSION: Diffuse right upper lobe infiltrate. Mild vascular congestion is noted as well. Electronically Signed   By: Inez Catalina M.D.   On: 05/10/2019 13:23    Procedures Procedures (including critical care time)  Medications Ordered in ED Medications - No data to display  ED Course  I have reviewed the triage vital signs and the nursing notes.  Pertinent labs & imaging results that were available during my care of the patient were reviewed by me and considered in my medical decision making (see chart for details).    Kaidon Kinker was evaluated in Emergency Department on 05/10/2019 for the symptoms described in the history of present illness. He/she was  evaluated in the context of the global COVID-19 pandemic, which necessitated consideration that the patient might be at risk for infection with the SARS-CoV-2 virus that causes COVID-19. Institutional protocols and algorithms that pertain to the evaluation of patients at risk for COVID-19 are in a state of rapid change based on information released by regulatory bodies including the CDC and federal and state organizations. These policies and algorithms were followed during the patient's care in the ED.  MDM Rules/Calculators/A&P                      Patient presents to the emergency department with complaints of generalized body aches, some weakness, as well as  some respiratory complaints.  He is noted to be in A. fib with varying rates, he has a history of this.  Has most of his tenderness throughout the right shoulder.  Has some mild abdominal tenderness that is generalized as well.  COVID: Negative CBC: No anemia or leukocytosis.  Mildly elevated platelets. CMP: Mild hypokalemia.  No acute kidney injury. Magnesium: Mildly low. CK: Mildly low CRP/sed rate: Mild elevation Urinalysis without UTI. TSH without thyroid dysfunction. Troponin without significant elevation. Chest x-ray showed findings concerning for pneumonia with infiltrate, plan for CT angio of the chest and CT abdomen/pelvis for further assessment.   CT scan findings with findings concerning for metastatic cancer.    16:11: CONSULT: Discussed with oncologist Dr. Sandrea Hughs will likely need biopsy to determine further management, this can be done inpatient versus outpatient based on patient functional ability at home.  If admitted will see patient in consultation, if discharged will set up follow-up in clinic.  On reassessment patient states pain is somewhat improved, he has been ambulatory in the emergency department, he is requesting to be discharged.  I discussed the results with patient and his wife at bedside, we  discussed disposition options, discussed risks/benefits.  Patient ultimately would like to be discharged home to follow-up outpatient with Dr. Delton Coombes.  Will discharge home with additional analgesics, discontinue hydrocodone and start oxycodone.  Patient remains in A. fib, has a history of this, is on digoxin but did not take this today, encouraged to take his medication as prescribed, he is not on anticoagulation-  will defer to PCP as they are managing this condition, rate on my assessment prior to discharge is 103, appears reasonable for discharge. I discussed results, treatment plan, need for follow-up, and return precautions with the patient & his wife @ bedside. Provided opportunity for questions, patient & his wife confirmed understanding and are in agreement with plan.   Findings and plan of care discussed with supervising physician Dr. Laverta Baltimore and subsequently Dr. Stark Jock @ change of shfit who are in agreement.   Final Clinical Impression(s) / ED Diagnoses Final diagnoses:  Mass of upper lobe of right lung  Metastatic malignant neoplasm, unspecified site [facility])  Atrial fibrillation, unspecified type [facility])    Rx / DC Orders ED Discharge Orders         Ordered    oxyCODONE-acetaminophen (PERCOCET/ROXICET) 5-325 MG tablet  Every 6 hours PRN     05/10/19 1656           Aryona Sill, Glynda Jaeger, PA-C 05/10/19 1722    Margette Fast, MD 05/13/19 2111

## 2019-05-10 NOTE — ED Triage Notes (Addendum)
Pt c/o generalized body pain, worsening over the last week. Pt reports he usually has arthritis and takes Hydrocodone for the pain three times daily, but reports the pain medication isn't helping at all anymore and that the pain that he is having has become more widespread. Denies injury. Wife reports in the last week pt has needed assistance with going to the restroom, ambulating and standing which is not normal for him. Pt describes that pain as soreness.

## 2019-05-10 NOTE — Discharge Instructions (Signed)
You were seen in the emergency department today for body aches.  Your CT scan showed findings concerning for cancer with metastasis to your bone.  We suspect this is what is causing your pain.  We would like you to follow-up very closely with Dr. Tomie China office, please call first thing tomorrow morning to schedule an appointment within the next 48 hours.  In the meantime we are sending home with Percocet to help with pain.  -Percocet-this is a narcotic/controlled substance medication that has potential addicting qualities.  We recommend that you take 1-2 tablets every 6 hours as needed for severe pain.  Do not drive or operate heavy machinery when taking this medicine as it can be sedating. Do not drink alcohol or take other sedating medications when taking this medicine for safety reasons.  Keep this out of reach of small children.  Please be aware this medicine has Tylenol in it (325 mg/tab) do not exceed the maximum dose of Tylenol in a day per over the counter recommendations should you decide to supplement with Tylenol over the counter.   Do not take this with your hydrocodone as it is a similar medicine.  You may continue to take this with your Celebrex.   Your potassium was a bit low, we replaced this in the emergency department, please follow attached diet guidelines.  You are in atrial fibrillation in the emergency department, please continue to take your digoxin, please follow-up with your primary care provider to continue management of this.  Return to the emergency department anytime for new or worsening symptoms including but not limited to worsening pain, trouble breathing, passing out, fever, or any other concerns.

## 2019-05-11 ENCOUNTER — Encounter (HOSPITAL_COMMUNITY): Payer: Self-pay | Admitting: *Deleted

## 2019-05-11 MED FILL — Oxycodone w/ Acetaminophen Tab 5-325 MG: ORAL | Qty: 6 | Status: AC

## 2019-05-11 NOTE — Progress Notes (Signed)
Oncology Navigator Note:  Patient was referred to our office following a visit to the ER.  I called patient today to introduce myself and provide information in how I will be involved with their care.  He is very hard of hearing but was able to hear me well over the phone.  I provided information on his first visit and what to expect.  I made sure patient was aware of appointment time and directions to the cancer center.  My phone number was given so that he or his wife can call me with any questions or concerns.  He voices appreciation and understanding.

## 2019-05-12 ENCOUNTER — Other Ambulatory Visit (HOSPITAL_COMMUNITY): Payer: Self-pay | Admitting: Hematology

## 2019-05-12 ENCOUNTER — Telehealth (HOSPITAL_COMMUNITY): Payer: Self-pay | Admitting: *Deleted

## 2019-05-12 MED ORDER — OXYCODONE-ACETAMINOPHEN 5-325 MG PO TABS: 1.0000 | ORAL_TABLET | Freq: Four times a day (QID) | ORAL | 0 refills | Status: DC | PRN

## 2019-05-12 NOTE — Telephone Encounter (Signed)
LMOM that pain medication had been sent into the pt's pharmacy.

## 2019-05-14 ENCOUNTER — Ambulatory Visit (HOSPITAL_COMMUNITY): Payer: Medicare HMO | Admitting: Hematology

## 2019-05-15 ENCOUNTER — Inpatient Hospital Stay (HOSPITAL_COMMUNITY): Payer: Medicare HMO | Attending: Hematology | Admitting: Hematology

## 2019-05-15 ENCOUNTER — Encounter (HOSPITAL_COMMUNITY): Payer: Self-pay | Admitting: Hematology

## 2019-05-15 ENCOUNTER — Other Ambulatory Visit: Payer: Self-pay

## 2019-05-15 DIAGNOSIS — C787 Secondary malignant neoplasm of liver and intrahepatic bile duct: Secondary | ICD-10-CM | POA: Insufficient documentation

## 2019-05-15 DIAGNOSIS — Z803 Family history of malignant neoplasm of breast: Secondary | ICD-10-CM | POA: Insufficient documentation

## 2019-05-15 DIAGNOSIS — C7951 Secondary malignant neoplasm of bone: Secondary | ICD-10-CM | POA: Diagnosis not present

## 2019-05-15 DIAGNOSIS — R918 Other nonspecific abnormal finding of lung field: Secondary | ICD-10-CM | POA: Insufficient documentation

## 2019-05-15 DIAGNOSIS — C801 Malignant (primary) neoplasm, unspecified: Secondary | ICD-10-CM

## 2019-05-15 DIAGNOSIS — Z87891 Personal history of nicotine dependence: Secondary | ICD-10-CM | POA: Diagnosis not present

## 2019-05-15 DIAGNOSIS — C7972 Secondary malignant neoplasm of left adrenal gland: Secondary | ICD-10-CM | POA: Diagnosis not present

## 2019-05-15 DIAGNOSIS — E119 Type 2 diabetes mellitus without complications: Secondary | ICD-10-CM | POA: Insufficient documentation

## 2019-05-15 DIAGNOSIS — Z809 Family history of malignant neoplasm, unspecified: Secondary | ICD-10-CM | POA: Diagnosis not present

## 2019-05-15 DIAGNOSIS — R0781 Pleurodynia: Secondary | ICD-10-CM | POA: Insufficient documentation

## 2019-05-15 DIAGNOSIS — Z801 Family history of malignant neoplasm of trachea, bronchus and lung: Secondary | ICD-10-CM

## 2019-05-15 DIAGNOSIS — R634 Abnormal weight loss: Secondary | ICD-10-CM

## 2019-05-15 DIAGNOSIS — C3491 Malignant neoplasm of unspecified part of right bronchus or lung: Secondary | ICD-10-CM | POA: Insufficient documentation

## 2019-05-15 DIAGNOSIS — R109 Unspecified abdominal pain: Secondary | ICD-10-CM | POA: Insufficient documentation

## 2019-05-15 MED ORDER — OXYCODONE-ACETAMINOPHEN 10-325 MG PO TABS: 1.0000 | ORAL_TABLET | Freq: Four times a day (QID) | ORAL | 0 refills | Status: DC | PRN

## 2019-05-15 NOTE — Patient Instructions (Addendum)
[place] at [facility] Discharge Instructions  You were seen today by Dr. Delton Coombes. He went over your history, family history and how you've been feeling since your ER visit. He will send in a new prescription for pain medication and he will only need to take 1 tablet instead of 2 tablets. He will schedule you for a biopsy and scans to further evaluate your cancer. He will see you back after your scans for follow up.   Thank you for choosing [place] at [facility] to provide your oncology and hematology care.  To afford each patient quality time with our provider, please arrive at least 15 minutes before your scheduled appointment time.   If you have a lab appointment with the [facility] please come in thru the  Main Entrance and check in at the main information desk  You need to re-schedule your appointment should you arrive 10 or more minutes late.  We strive to give you quality time with our providers, and arriving late affects you and other patients whose appointments are after yours.  Also, if you no show three or more times for appointments you may be dismissed from the clinic at the providers discretion.     Again, thank you for choosing [facility].  Our hope is that these requests will decrease the amount of time that you wait before being seen by our physicians.       _____________________________________________________________  Should you have questions after your visit to [facility], please contact our office at [phone] between the hours of 8:00 a.m. and 4:30 p.m.  Voicemails left after 4:00 p.m. will not be returned until the following business day.  For prescription refill requests, have your pharmacy contact our office and allow 72 hours.    Cancer Center Support Programs:   > Cancer Support Group  2nd Tuesday of the month 1pm-2pm, Journey Room

## 2019-05-15 NOTE — Progress Notes (Signed)
[facility] NOTE  Patient Care Team: Wannetta Sender, FNP as PCP - General (Family Medicine) Donetta Potts, RN as Oncology Nurse Navigator (Oncology) Derek Jack, MD as Medical Oncologist (Oncology)  CHIEF COMPLAINTS/PURPOSE OF CONSULTATION:  Highly likely metastatic right lung cancer to the liver, adrenal and bones.  HISTORY OF PRESENTING ILLNESS:  Dustin Shaw 86 y.o. male is seen at the request of ER for further work-up and management of metastatic lung cancer.  He came to the ER on 05/10/2019 with generalized weakness and body pains.  He reportedly had a history of rheumatoid arthritis and osteoarthritis.  However he developed new pains in the ribs, back and abdominal area for the last 3 to 6 months.  He reportedly lost 30 pounds in the last 6 months.  He also reported difficulty raising his right shoulder above his head level.  He is a ex-smoker, quit smoking 30 years ago.  He smoked 2 packs/day for 30 to 45 years.  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  When he came to the ER, a CT of the chest showed right upper lobe lung mass with hilar and mediastinal adenopathy.  There was also lytic bone lesions in the ribs and thoracic vertebral bodies.  A CT of the abdomen and pelvis showed metastatic disease to the liver and left adrenal gland along with lytic lesions to the L1 vertebral body and L5 vertebral body.  He was given a prescription for oxycodone 5 mg in the ER.  He was set up to see is as outpatient.  Patient currently taking oxycodone 5 mg 2 tablets every 6 hours.  He lives at home with his wife.  His appetite is 50%.  Energy levels are 75%.  Once the pain is controlled, his energy levels improved and he is able to move around.  Family history significant for father with lung cancer.  Mother had some type of cancer.  Brother also had lung cancer.  Sister had lung and breast cancer.  Several uncles and aunts on both sides of the  family also had cancers.  MEDICAL HISTORY:  Past Medical History:  Diagnosis Date  . Arthritis   . BPH (benign prostatic hyperplasia)   . Diabetes mellitus without complication ([place])     SURGICAL HISTORY: Past Surgical History:  Procedure Laterality Date  . CHOLECYSTECTOMY      SOCIAL HISTORY: Social History   Socioeconomic History  . Marital status: Married    Spouse name: Not on file  . Number of children: Not on file  . Years of education: Not on file  . Highest education level: Not on file  Occupational History  . Not on file  Tobacco Use  . Smoking status: Former Research scientist (life sciences)  . Smokeless tobacco: Current User    Types: Chew  Substance and Sexual Activity  . Alcohol use: Never  . Drug use: Never  . Sexual activity: Not on file  Other Topics Concern  . Not on file  Social History Narrative  . Not on file   Social Determinants of Health   Financial Resource Strain: Low Risk   . Difficulty of Paying Living Expenses: Not very hard  Food Insecurity: No Food Insecurity  . Worried About Charity fundraiser in the Last Year: Never true  . Ran Out of Food in the Last Year: Never true  Transportation Needs: No Transportation Needs  . Lack of Transportation (Medical): No  . Lack of  Transportation (Non-Medical): No  Physical Activity: Inactive  . Days of Exercise per Week: 0 days  . Minutes of Exercise per Session: 0 min  Stress: No Stress Concern Present  . Feeling of Stress : Not at all  Social Connections: Slightly Isolated  . Frequency of Communication with Friends and Family: More than three times a week  . Frequency of Social Gatherings with Friends and Family: More than three times a week  . Attends Religious Services: More than 4 times per year  . Active Member of Clubs or Organizations: No  . Attends Archivist Meetings: Never  . Marital Status: Married  Human resources officer Violence: Not At Risk  . Fear of Current or Ex-Partner: No  . Emotionally  Abused: No  . Physically Abused: No  . Sexually Abused: No    FAMILY HISTORY: No family history on file.  ALLERGIES:  is allergic to amoxicillin and codeine.  MEDICATIONS:  Current Outpatient Medications  Medication Sig Dispense Refill  . celecoxib (CELEBREX) 200 MG capsule Take 200 mg by mouth daily.    . cetirizine (ZYRTEC) 10 MG tablet Take 10 mg by mouth daily.    . cholecalciferol (VITAMIN D3) 25 MCG (1000 UNIT) tablet Take 1,000 Units by mouth daily.    . digoxin (LANOXIN) 0.125 MG tablet Take 125 mcg by mouth daily.    . metFORMIN (GLUCOPHAGE) 500 MG tablet Take 500 mg by mouth 2 (two) times daily.    . tamsulosin (FLOMAX) 0.4 MG CAPS capsule Take 0.4 mg by mouth every evening.     . traZODone (DESYREL) 50 MG tablet Take 50 mg by mouth in the morning and at bedtime. Takes one tablet at dinner and one tablet at bedtime    . oxyCODONE-acetaminophen (PERCOCET) 10-325 MG tablet Take 1 tablet by mouth every 6 (six) hours as needed for pain. 60 tablet 0   No current facility-administered medications for this visit.    REVIEW OF SYSTEMS:   Constitutional: Denies fevers, chills or abnormal night sweats Eyes: Denies blurriness of vision, double vision or watery eyes Ears, nose, mouth, throat, and face: Denies mucositis or sore throat Respiratory: Positive for dyspnea on exertion and chronic cough. Cardiovascular: Denies palpitation, chest discomfort or lower extremity swelling Gastrointestinal:  Denies nausea, heartburn or change in bowel habits.  Positive for diarrhea. Skin: Denies abnormal skin rashes Lymphatics: Denies new lymphadenopathy or easy bruising Neurological:Denies numbness, tingling or new weaknesses Behavioral/Psych: Mood is stable, no new changes  All other systems were reviewed with the patient and are negative.  PHYSICAL EXAMINATION: ECOG PERFORMANCE STATUS: 1 - Symptomatic but completely ambulatory  There were no vitals filed for this visit. There were no  vitals filed for this visit.  GENERAL:alert, no distress and comfortable SKIN: skin color, texture, turgor are normal, no rashes or significant lesions EYES: normal, conjunctiva are pink and non-injected, sclera clear OROPHARYNX:no exudate, no erythema and lips, buccal mucosa, and tongue normal  NECK: supple, thyroid normal size, non-tender, without nodularity LYMPH:  no palpable lymphadenopathy in the cervical, axillary or inguinal LUNGS: clear to auscultation and percussion with normal breathing effort HEART: regular rate & rhythm and no murmurs and no lower extremity edema ABDOMEN:abdomen soft, non-tender and normal bowel sounds Musculoskeletal:no cyanosis of digits and no clubbing  PSYCH: alert & oriented x 3 with fluent speech NEURO: no focal motor/sensory deficits  LABORATORY DATA:  I have reviewed the data as listed Lab Results  Component Value Date   WBC 7.9 05/10/2019  HGB 14.8 05/10/2019   HCT 46.6 05/10/2019   MCV 90.1 05/10/2019   PLT 420 (H) 05/10/2019     Chemistry      Component Value Date/Time   NA 136 05/10/2019 1251   K 3.3 (L) 05/10/2019 1251   CL 100 05/10/2019 1251   CO2 26 05/10/2019 1251   BUN 8 05/10/2019 1251   CREATININE 0.49 (L) 05/10/2019 1251      Component Value Date/Time   CALCIUM 9.2 05/10/2019 1251   ALKPHOS 92 05/10/2019 1251   AST 17 05/10/2019 1251   ALT 12 05/10/2019 1251   BILITOT 0.7 05/10/2019 1251       RADIOGRAPHIC STUDIES: I have personally reviewed the radiological images as listed and agreed with the findings in the report. DG Shoulder Right  Result Date: 05/10/2019 CLINICAL DATA:  Right shoulder pain EXAM: RIGHT SHOULDER - 2+ VIEW COMPARISON:  None. FINDINGS: Degenerative changes are noted in the glenohumeral joint and acromioclavicular joint. Humeral head is somewhat high-riding likely related to chronic rotator cuff injury. No definitive fracture is seen. IMPRESSION: Degenerative change without acute abnormality.  Electronically Signed   By: Inez Catalina M.D.   On: 05/10/2019 13:24   CT Angio Chest PE W/Cm &/Or Wo Cm  Result Date: 05/10/2019 CLINICAL DATA:  Short of breath, generalized body pain EXAM: CT ANGIOGRAPHY CHEST WITH CONTRAST TECHNIQUE: Multidetector CT imaging of the chest was performed using the standard protocol during bolus administration of intravenous contrast. Multiplanar CT image reconstructions and MIPs were obtained to evaluate the vascular anatomy. CONTRAST:  112m OMNIPAQUE IOHEXOL 350 MG/ML SOLN COMPARISON:  05/10/2019 FINDINGS: Cardiovascular: This is a technically adequate evaluation of the pulmonary vasculature. There are no filling defects or pulmonary emboli. There is trace pericardial effusion. The heart is not enlarged. Mild to moderate atherosclerosis of the aorta and coronary vessels. Mediastinum/Nodes: There is diffuse lymphadenopathy throughout the mediastinum and hila. Precarinal adenopathy measures up to 3.1 cm on image 43, subcarinal adenopathy measures up to 2.5 cm reference image 54, enlarged right hilar mass/adenopathy measures up to 4.1 cm reference image 47. Overall, the findings are consistent with bronchogenic right upper lobe neoplasm and mediastinal and ipsilateral hilar metastatic adenopathy. Lungs/Pleura: There is masslike consolidation within the right upper lobe, measuring 7.5 x 7.0 cm reference image 46. It is unclear whether this reflects underlying neoplasm or is postobstructive in nature, as there is abrupt cut off of the right upper lobe bronchus on image 47 adjacent to the right hilar mass described previously. PET-CT is recommended for further evaluation. Diffuse interlobular septal thickening throughout the right upper lobe is suspicious for lymphangitic spread of disease. Bibasilar interstitial prominence likely reflects scarring and fibrosis. No effusion or pneumothorax. There is severe background emphysema. Upper Abdomen: Metastatic disease is seen within the  liver. Please refer to corresponding CT abdomen report. Musculoskeletal: Lytic metastatic lesions are seen within the left anterior fourth rib, left anterolateral fifth rib and right posterior eleventh rib at the costovertebral junction. No pathologic fractures. There is relative lucency within the vertebral bodies from T8 through T12, metastatic disease not excluded. Lytic lesion within the superior aspect of the L1 vertebral body most likely reflects additional metastatic focus. Review of the MIP images confirms the above findings. IMPRESSION: 1. Masslike consolidation in the right upper lobe as well as a right hilar mass resulting in right upper lobe bronchial obstruction, consistent with bronchogenic malignancy. 2. Massive mediastinal and right hilar adenopathy consistent with nodal metastases. 3. Multifocal bony metastatic disease  as above. 4. Interstitial prominence throughout the right upper lobe highly suspicious for lymphangitic spread of disease. 5. No evidence of pulmonary embolus. 6. Aortic Atherosclerosis (ICD10-I70.0) and Emphysema (ICD10-J43.9). Electronically Signed   By: Randa Ngo M.D.   On: 05/10/2019 15:49   CT Abdomen Pelvis W Contrast  Result Date: 05/10/2019 CLINICAL DATA:  Generalized body pain, abnormal chest x-ray EXAM: CT ABDOMEN AND PELVIS WITH CONTRAST TECHNIQUE: Multidetector CT imaging of the abdomen and pelvis was performed using the standard protocol following bolus administration of intravenous contrast. CONTRAST:  149m OMNIPAQUE IOHEXOL 350 MG/ML SOLN COMPARISON:  05/10/2019, 12/17/2018 FINDINGS: Lower chest: Please refer to findings within the CT chest report describing bronchogenic malignancy and intrathoracic metastatic disease. Hepatobiliary: There are multiple liver hypodensities consistent with metastatic disease, largest in the right lobe reference image 10 measuring 1.8 x 1.9 cm. Gallbladder surgically absent. Pancreas: Unremarkable. No pancreatic ductal dilatation  or surrounding inflammatory changes. Spleen: Normal in size without focal abnormality. Adrenals/Urinary Tract: Left adrenal thickening measures 16 mm, concerning for metastatic disease given chest CT findings. Right adrenals unremarkable. The kidneys enhance normally and symmetrically. Normal excretion of contrast into unremarkable bilateral pelvocaliceal structures. Bladder is unremarkable. Stomach/Bowel: No bowel obstruction or ileus. No inflammatory changes. Vascular/Lymphatic: Aortic atherosclerosis. No enlarged abdominal or pelvic lymph nodes. Reproductive: Prostate is unremarkable. Other: No abdominal wall hernia or abnormality. No abdominopelvic ascites. Musculoskeletal: Lytic metastatic lesions are seen within the right superior aspect of the L1 vertebral body as well as the inferior aspect of the L5 vertebral body. I do not see any pathologic fracture. Reconstructed images demonstrate no additional findings. IMPRESSION: 1. Metastatic disease within the liver, left adrenal gland, and lumbar spine. Please refer to CT chest report describing primary lung cancer and intrathoracic metastases. Electronically Signed   By: MRanda NgoM.D.   On: 05/10/2019 15:53   DG Chest Portable 1 View  Result Date: 05/10/2019 CLINICAL DATA:  Generalized body pain and dyspnea EXAM: PORTABLE CHEST 1 VIEW COMPARISON:  None. FINDINGS: Cardiac shadows within normal limits. Aortic calcifications are seen. Vascular congestion is noted as well as diffuse right upper lobe infiltrate consistent with acute pneumonia. No bony abnormality is noted. IMPRESSION: Diffuse right upper lobe infiltrate. Mild vascular congestion is noted as well. Electronically Signed   By: MInez CatalinaM.D.   On: 05/10/2019 13:23    ASSESSMENT & PLAN:  Metastatic lung cancer (metastasis from lung to other site[place]  Metastatic right lung cancer to the liver and bones: -Presentation to the ER on 05/10/2019 with generalized body pains and  weakness.  Weight loss of 30 pounds in the last 6 months. -CT chest showed right upper lobe mass as well as right hilar mass and mediastinal adenopathy.  Lytic metastasis in the left anterior fourth rib, left anterolateral fifth rib, right posterior 11th rib.  Relative lucency within the vertebral bodies of T8-T12, metastatic disease not excluded.  Lytic lesion within the superior aspect of the L1 vertebral body. -CT of the abdomen and pelvis reviewed by me on 05/10/2019 showed metastatic disease within the liver, left adrenal gland.  Lytic metastatic lesions within the right superior aspect of the L1 vertebral body as well as inferior aspect of the L5 vertebral body. -I have recommended CT-guided biopsy of the right lung mass.  If lung cancer confirmed, he will need testing for PD-L1 and foundation 1. -I have also recommended MRI of the brain with and without gadolinium for staging.  Will obtain MRI of  the thoracic spine to see if he has any epidural masses.  He does not have any weakness in the lower extremities at this time. -I will see him back after the scan.  2.  Diffuse body pains: -He reported pain in his ribs, stomach area and the back for the past 3 to 6 months. -I have given Percocet 5 mg, he is taking 2 tablets every 6 hours. -I will give him a prescription for Percocet 10/325 to be taken 1 tablet every 6 hours #60.  Orders Placed This Encounter  Procedures  . MR BRAIN W WO CONTRAST    Standing Status:   Future    Standing Expiration Date:   05/14/2020    Order Specific Question:   ** REASON FOR EXAM (FREE TEXT)    Answer:   metastatic lung cancer    Order Specific Question:   If indicated for the ordered procedure, I authorize the administration of contrast media per Radiology protocol    Answer:   Yes    Order Specific Question:   What is the patient's sedation requirement?    Answer:   No Sedation    Order Specific Question:   Does the patient have a pacemaker or implanted  devices?    Answer:   No    Order Specific Question:   Use SRS Protocol?    Answer:   No    Order Specific Question:   Radiology Contrast Protocol - do NOT remove file path    Answer:   \\[email or web address]    Order Specific Question:   Preferred imaging location?    Answer:   [facility] (table limit-350 lbs)  . MR Thoracic Spine W Wo Contrast    Standing Status:   Future    Standing Expiration Date:   05/14/2020    Order Specific Question:   ** REASON FOR EXAM (FREE TEXT)    Answer:   metastatic lung cancer    Order Specific Question:   GRA to provide read?    Answer:   Yes    Order Specific Question:   If indicated for the ordered procedure, I authorize the administration of contrast media per Radiology protocol    Answer:   Yes    Order Specific Question:   What is the patient's sedation requirement?    Answer:   No Sedation    Order Specific Question:   Use SRS Protocol?    Answer:   No    Order Specific Question:   Does the patient have a pacemaker or implanted devices?    Answer:   No    Order Specific Question:   Preferred imaging location?    Answer:   [facility] (table limit-350 lbs)    Order Specific Question:   Radiology Contrast Protocol - do NOT remove file path    Answer:   \\[email or web address].PDF  . CT Biopsy    Lung biopsy    Standing Status:   Future    Standing Expiration Date:   05/14/2020    Order Specific Question:   Lab orders requested (DO NOT place separate lab orders, these will be automatically ordered during procedure specimen collection):    Answer:   Surgical Pathology    Order Specific Question:   Reason for Exam (SYMPTOM  OR DIAGNOSIS REQUIRED)    Answer:   metastatic lung cancer    Order Specific Question:   Preferred location?    Answer:   [facility]  Order Specific Question:   Radiology Contrast Protocol - do NOT remove file path    Answer:    \\[email or web address]\[email or web address]\CTProtocols.pdf   Total time spent is 60 minutes with more than 70% of the time spent face-to-face discussing scan findings, further work-up, counseling and coordination of care. All questions were answered. The patient knows to call the clinic with any problems, questions or concerns.      Derek Jack, MD 05/15/2019 12:35 PM

## 2019-05-15 NOTE — Assessment & Plan Note (Addendum)
1.  Metastatic right lung cancer to the liver and bones: -Presentation to the ER on 05/10/2019 with generalized body pains and weakness.  Weight loss of 30 pounds in the last 6 months. -CT chest showed right upper lobe mass as well as right hilar mass and mediastinal adenopathy.  Lytic metastasis in the left anterior fourth rib, left anterolateral fifth rib, right posterior 11th rib.  Relative lucency within the vertebral bodies of T8-T12, metastatic disease not excluded.  Lytic lesion within the superior aspect of the L1 vertebral body. -CT of the abdomen and pelvis reviewed by me on 05/10/2019 showed metastatic disease within the liver, left adrenal gland.  Lytic metastatic lesions within the right superior aspect of the L1 vertebral body as well as inferior aspect of the L5 vertebral body. -I have recommended CT-guided biopsy of the right lung mass.  If lung cancer confirmed, he will need testing for PD-L1 and foundation 1. -I have also recommended MRI of the brain with and without gadolinium for staging.  Will obtain MRI of the thoracic spine to see if he has any epidural masses.  He does not have any weakness in the lower extremities at this time. -I will see him back after the scan.  2.  Diffuse body pains: -He reported pain in his ribs, stomach area and the back for the past 3 to 6 months. -I have given Percocet 5 mg, he is taking 2 tablets every 6 hours. -I will give him a prescription for Percocet 10/325 to be taken 1 tablet every 6 hours #60.

## 2019-05-18 ENCOUNTER — Other Ambulatory Visit (HOSPITAL_COMMUNITY): Payer: Self-pay | Admitting: Hematology

## 2019-05-18 ENCOUNTER — Encounter (HOSPITAL_COMMUNITY): Payer: Self-pay

## 2019-05-18 DIAGNOSIS — C3491 Malignant neoplasm of unspecified part of right bronchus or lung: Secondary | ICD-10-CM

## 2019-05-18 NOTE — Progress Notes (Signed)
CD   Zaevion Parke Male, 86 y.o., December 08, 1932 MRN:  [id number] Phone:  [phone] (H) ... PCP:  Wannetta Sender, FNP Coverage:  [employer] Medicare/[employer] Medicare Hmo Next Appt With Radiology ([facility] 1) 05/26/2019 at 3:00 PM  FW: Biopsy Received: 2 days ago Message Contents  Arne Cleveland, MD  Lenore Cordia   Cc: Derek Jack, MD   OK   Sched for [place] core liver lesion  Do [place] abd limited 1st to see if lesion(s) visible before pt goes to short stay   [place]   Previous Messages  ----- Message -----  From: Derek Jack, MD  Sent: 05/16/2019  9:59 AM EST  To: Arne Cleveland, MD  Subject: RE: Biopsy                    US liver as I need sufficient tissue for molecular testing. Do you want [place] to order the [place] or you will facilitate?  Thanks  ----- Message -----  From: Arne Cleveland, MD  Sent: 05/15/2019  5:17 PM EST  To: Derek Jack, MD, Lenore Cordia  Subject: FW: Biopsy                    Central obstructing R lesion prob best to approach , bronchoscopically   As CT reported, peripheral lung opacity possibly just consolidation so I would not start there   Alternatively we could get US liver to see if any lesions visible and approachable for core bx   Pleaes let me know your preference   Thanks  Arne Cleveland  IR  ----- Message -----  From: Lenore Cordia  Sent: 05/15/2019 12:51 PM EST  To: Ir Procedure Requests  Subject: Biopsy                      Procedure Requested: CT Biopsy    Reason for Procedure: metastatic lung cancer    Provider Requesting: Derek Jack  Provider Telephone:   [phone]    Other Info: Rad exam Epic

## 2019-05-26 ENCOUNTER — Other Ambulatory Visit: Payer: Self-pay | Admitting: Radiology

## 2019-05-26 ENCOUNTER — Ambulatory Visit (HOSPITAL_COMMUNITY)
Admission: RE | Admit: 2019-05-26 | Discharge: 2019-05-26 | Disposition: A | Discharge status: Home / Self Care | Payer: Medicare HMO | Source: Ambulatory Visit | Attending: Hematology | Admitting: Hematology

## 2019-05-26 ENCOUNTER — Other Ambulatory Visit: Payer: Self-pay

## 2019-05-26 DIAGNOSIS — C3491 Malignant neoplasm of unspecified part of right bronchus or lung: Secondary | ICD-10-CM | POA: Insufficient documentation

## 2019-05-26 IMAGING — MR MR HEAD WO/W CM
1 series · 15 of 15 positions shown · IV contrast (gadavist)
Comparison: None.

CLINICAL DATA: Metastatic lung cancer

EXAM:
MRI HEAD WITHOUT AND WITH CONTRAST
TECHNIQUE: Multiplanar, multiecho pulse sequences of the brain and surrounding
structures were obtained without and with intravenous contrast.
CONTRAST:  7mL GADAVIST GADOBUTROL 1 MMOL/ML IV SOLN

[Series 45: T1 · sagittal · 4.0mm · 1.72mm/px · 15 of 15 slices shown]
[im 1/15]
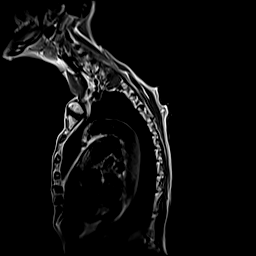
[im 2/15]
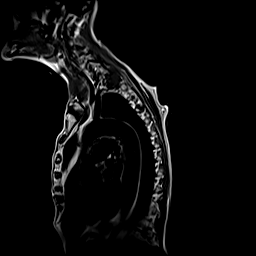
[im 3/15]
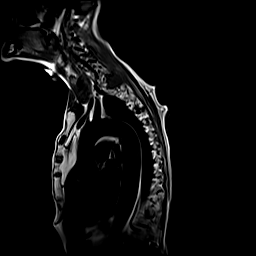
[im 4/15]
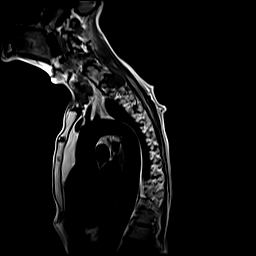
[im 5/15]
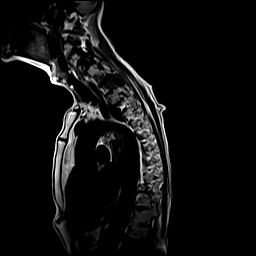
[im 6/15]
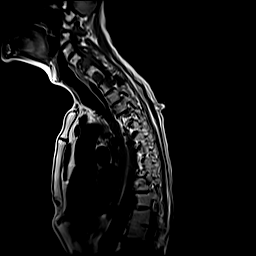
[im 7/15]
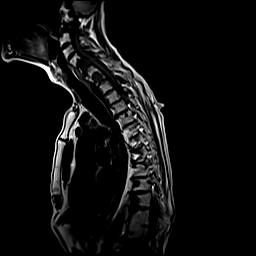
[im 8/15]
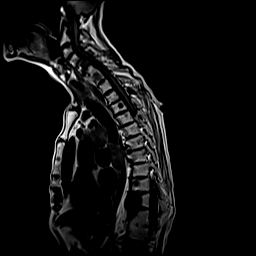
[im 9/15]
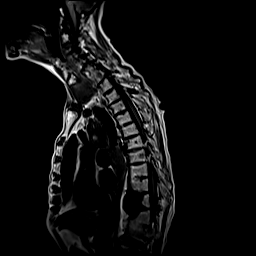
[im 10/15]
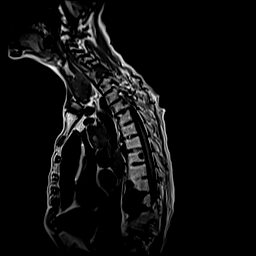
[im 11/15]
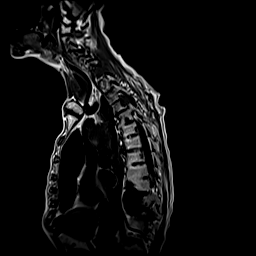
[im 12/15]
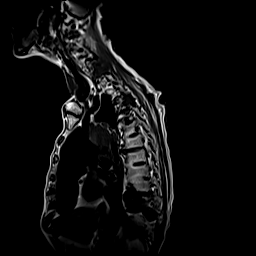
[im 13/15]
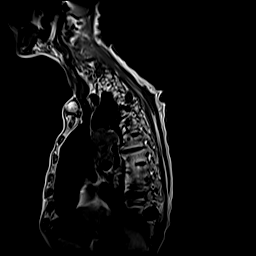
[im 14/15]
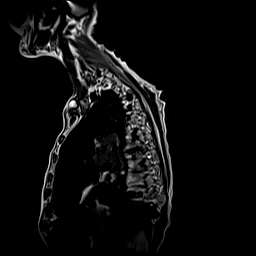
[im 15/15]
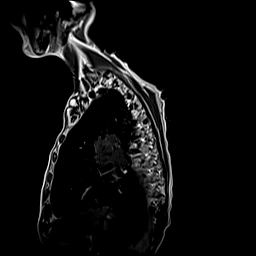

[15 of 15 positions shown; findings below may reference images not displayed]

FINDINGS: Brain: No acute infarction, hemorrhage, hydrocephalus, extra-axial
collection or mass lesion.

Scattered foci of T2 hyperintensity are seen within the white matter
of the cerebral hemispheres, nonspecific, most likely related to
chronic small vessel ischemia. There is mild prominence of the
supratentorial ventricles and cerebral sulci reflecting parenchymal
volume loss, age-appropriate.

No focus of abnormal contrast enhancement seen to suggest metastatic
disease to the brain.

Vascular: Normal flow voids.

Skull and upper cervical spine: Limited views of the cervical spine
shows involvement of the C5 vertebral body with retropulsion of the
posterior wall with mild encroachment on the spinal cord at this
level.

Sinuses/Orbits: Bilateral lens surgery. Visualized paranasal sinuses
are clear.

Other: None.
IMPRESSION: 1. No evidence of intracranial metastatic disease.
2. Mild-to-moderate chronic white matter ischemic changes.
3. C5 vertebral body metastatic involvement with retropulsion of the
posterior wall with mild encroachment on the spinal cord.

## 2019-05-26 IMAGING — MR MR HEAD WO/W CM
14 series · 46 of 48 positions shown · IV contrast (gadavist)
Comparison: None.

CLINICAL DATA: Metastatic lung cancer

EXAM:
MRI HEAD WITHOUT AND WITH CONTRAST
TECHNIQUE: Multiplanar, multiecho pulse sequences of the brain and surrounding
structures were obtained without and with intravenous contrast.
CONTRAST:  7mL GADAVIST GADOBUTROL 1 MMOL/ML IV SOLN

[Series 5: T2 · sagittal · 5.0mm · 0.47mm/px · 2 of 24 slices shown (1 of 2)]
[im 1/24]
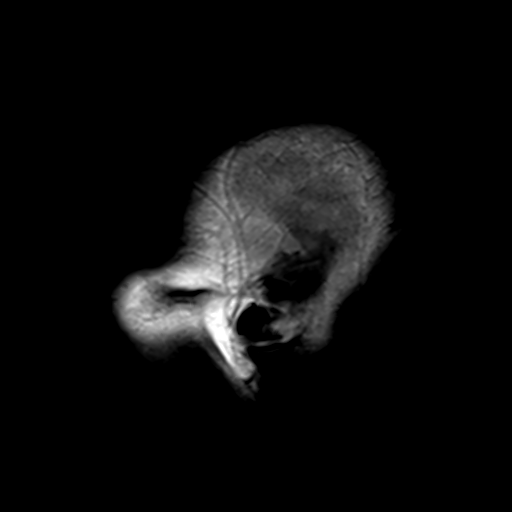
[im 24/24]
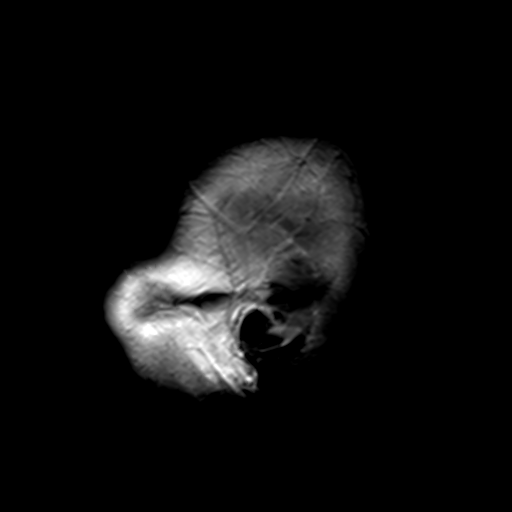

[Series 6: T2 · axial · 5.0mm · 0.45mm/px · z∈[-1,+147]mm · 2 of 25 slices shown (2 of 2)]
[im 1/25]
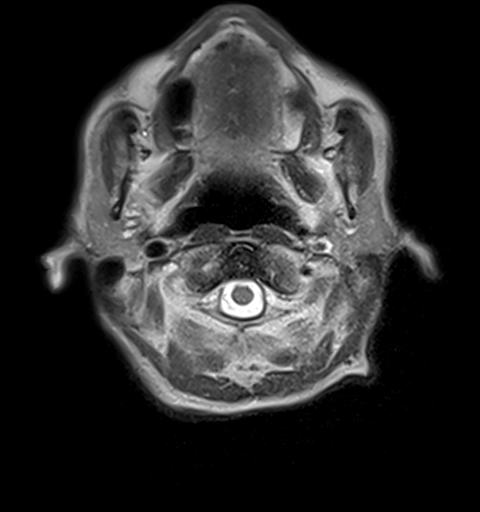
[im 25/25]
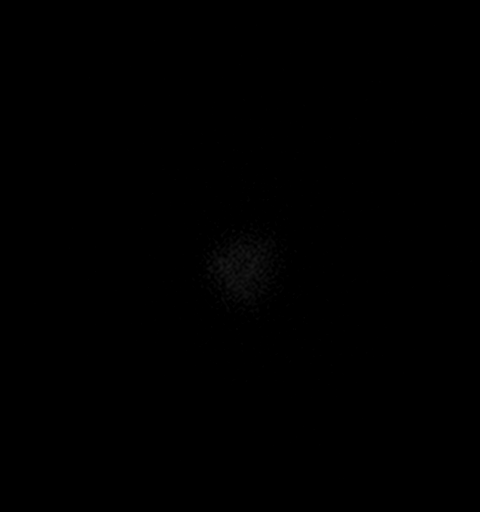

[Series 7: DWI · axial · 3.0mm · 1.36mm/px · z∈[+21,+148]mm · 7 of 92 slices shown (1 of 4)]
[im 1/92]
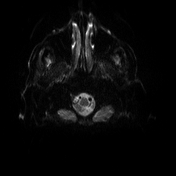
[im 16/92]
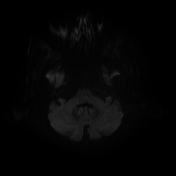
[im 31/92]
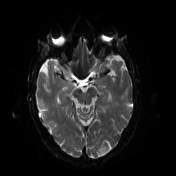
[im 46/92]
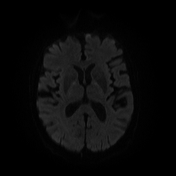
[im 61/92]
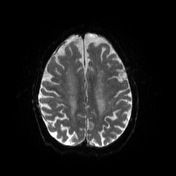
[im 76/92]
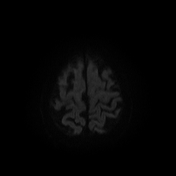
[im 92/92]
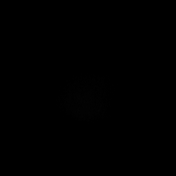

[Series 8: DWI · axial · 3.0mm · 1.36mm/px · z∈[+21,+145]mm · 3 of 45 slices shown (2 of 4)]
[im 1/45]
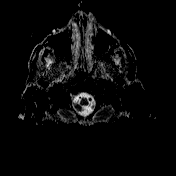
[im 23/45]
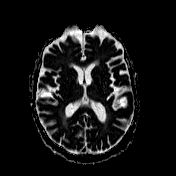
[im 45/45]
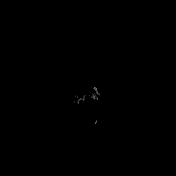

[Series 9: mip_images(sw) · axial · 24.0mm · 0.75mm/px · z∈[+14,+138]mm · 3 of 45 slices shown]
[im 1/45]
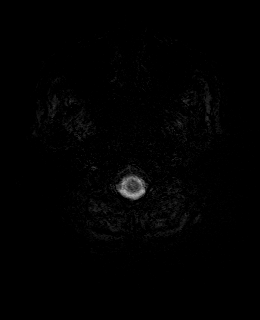
[im 23/45]
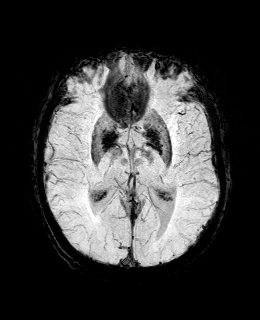
[im 45/45]
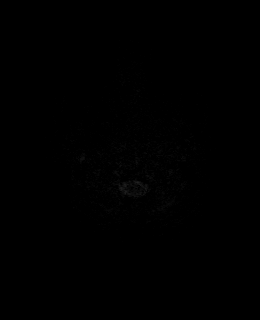

[Series 10: swi_images · axial · 3.0mm · 0.75mm/px · z∈[+4,+52]mm · 2 of 52 slices shown]
[im 1/52]
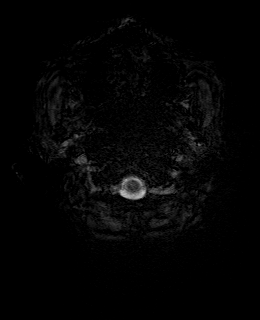
[im 18/52]
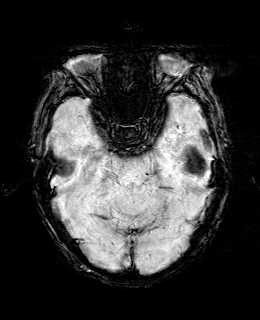

[Series 11: FLAIR · axial · 3.0mm · 0.86mm/px · z∈[+4,+141]mm · 4 of 49 slices shown]
[im 1/49]
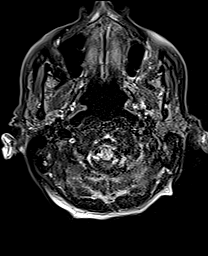
[im 17/49]
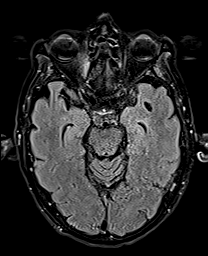
[im 33/49]
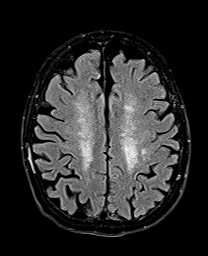
[im 49/49]
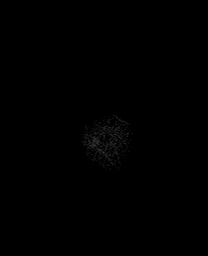

[Series 12: T1 · axial · 3.0mm · 0.45mm/px · z∈[+6,+145]mm · 4 of 50 slices shown]
[im 1/50]
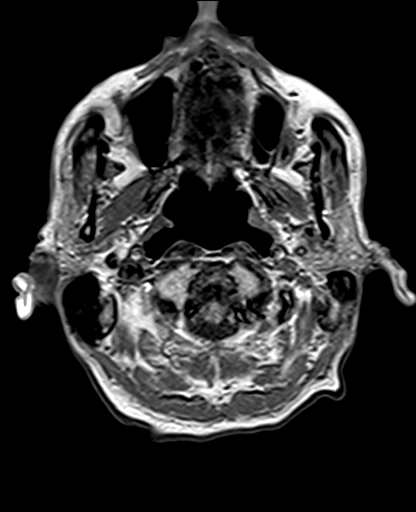
[im 17/50]
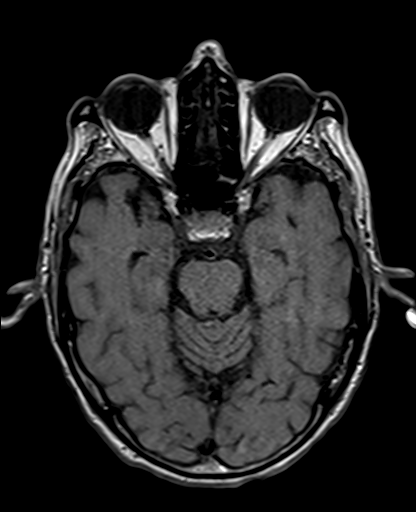
[im 33/50]
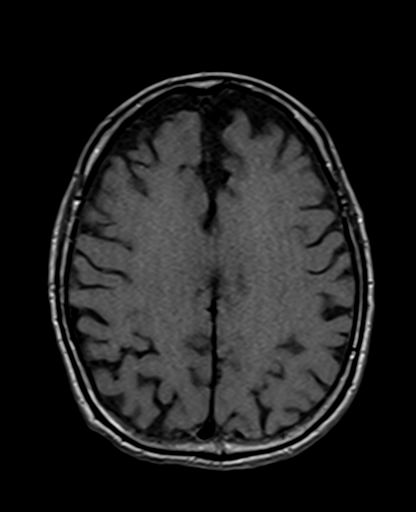
[im 50/50]
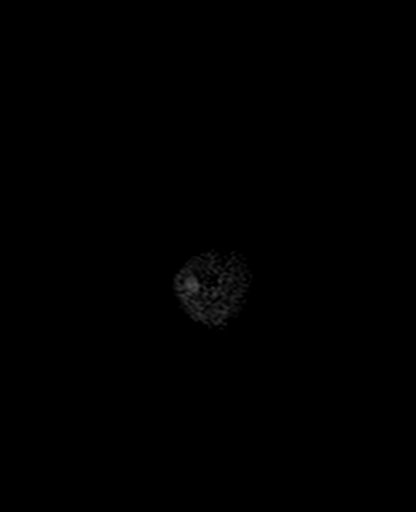

[Series 13: DWI · coronal · 5.0mm · 1.31mm/px · 6 of 76 slices shown (3 of 4)]
[im 1/76]
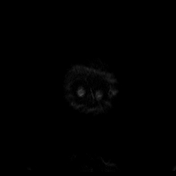
[im 16/76]
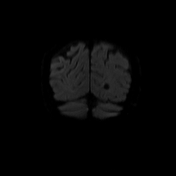
[im 31/76]
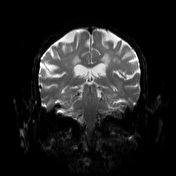
[im 46/76]
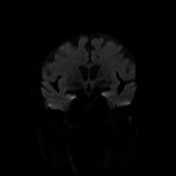
[im 61/76]
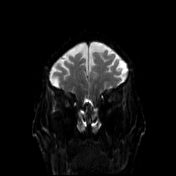
[im 76/76]
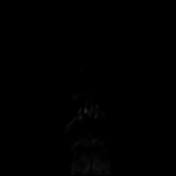

[Series 14: DWI · coronal · 5.0mm · 1.31mm/px · 3 of 38 slices shown (4 of 4)]
[im 1/38]
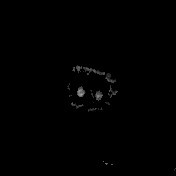
[im 19/38]
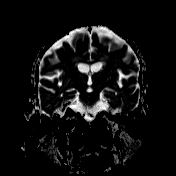
[im 38/38]
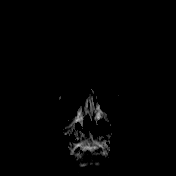

[Series 58: T2 post-contrast · coronal · 5.0mm · 0.86mm/px · 2 of 32 slices shown]
[im 1/32]
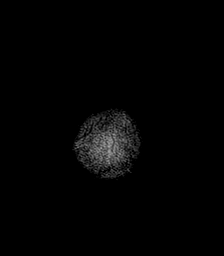
[im 32/32]
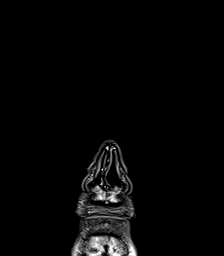

[Series 59: T1 post-contrast · axial · 3.0mm · 0.45mm/px · z∈[-35,+105]mm · 4 of 50 slices shown (1 of 3)]
[im 1/50]
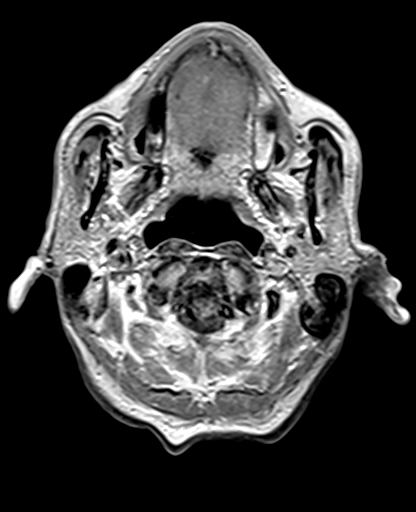
[im 17/50]
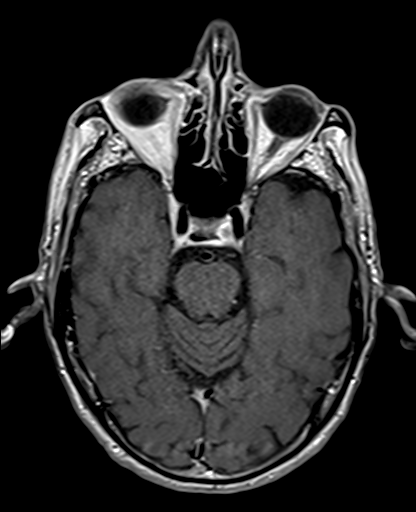
[im 33/50]
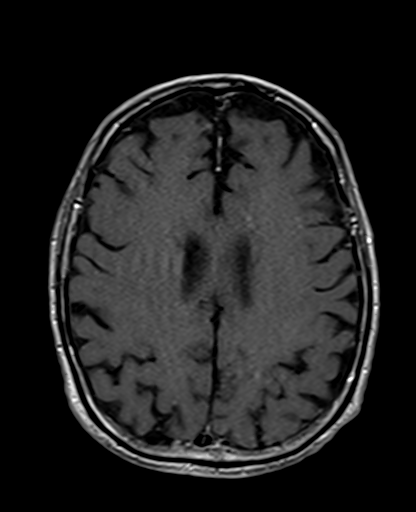
[im 50/50]
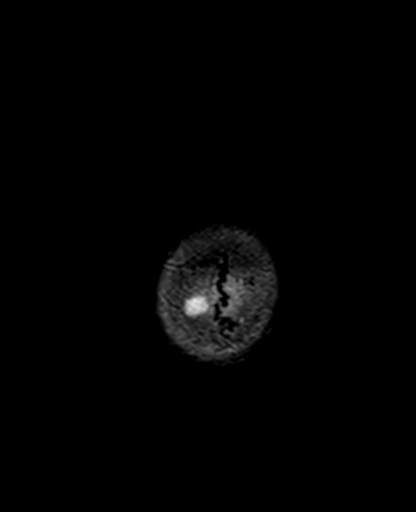

[Series 60: T1 post-contrast · coronal · 5.0mm · 0.43mm/px · 2 of 32 slices shown (2 of 3)]
[im 1/32]
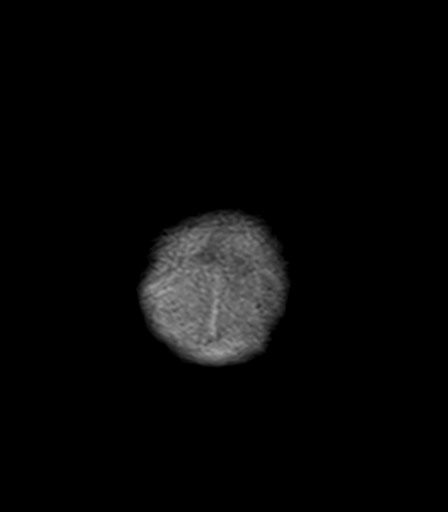
[im 32/32]
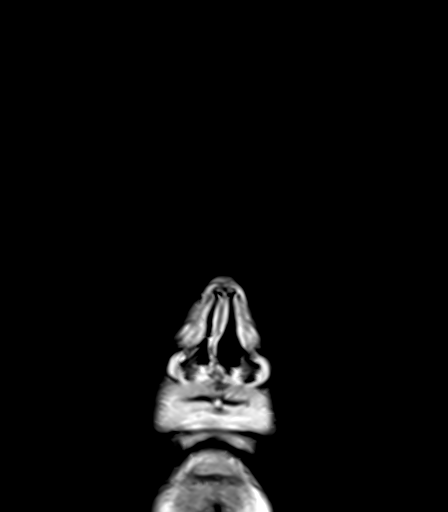

[Series 61: T1 post-contrast · sagittal · 5.0mm · 0.94mm/px · 2 of 24 slices shown (3 of 3)]
[im 1/24]
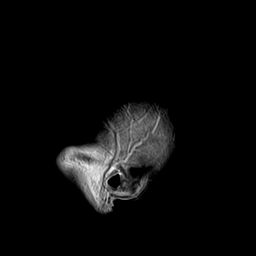
[im 24/24]
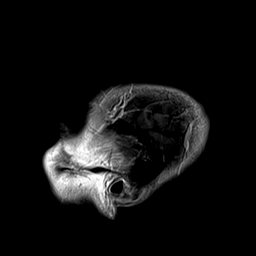

[46 of 48 positions shown; findings below may reference images not displayed]

FINDINGS: Brain: No acute infarction, hemorrhage, hydrocephalus, extra-axial
collection or mass lesion.

Scattered foci of T2 hyperintensity are seen within the white matter
of the cerebral hemispheres, nonspecific, most likely related to
chronic small vessel ischemia. There is mild prominence of the
supratentorial ventricles and cerebral sulci reflecting parenchymal
volume loss, age-appropriate.

No focus of abnormal contrast enhancement seen to suggest metastatic
disease to the brain.

Vascular: Normal flow voids.

Skull and upper cervical spine: Limited views of the cervical spine
shows involvement of the C5 vertebral body with retropulsion of the
posterior wall with mild encroachment on the spinal cord at this
level.

Sinuses/Orbits: Bilateral lens surgery. Visualized paranasal sinuses
are clear.

Other: None.
IMPRESSION: 1. No evidence of intracranial metastatic disease.
2. Mild-to-moderate chronic white matter ischemic changes.
3. C5 vertebral body metastatic involvement with retropulsion of the
posterior wall with mild encroachment on the spinal cord.

## 2019-05-26 IMAGING — MR MR THORACIC SPINE WO/W CM
8 of 9 series · 33 of 48 positions shown · IV contrast ([ID] GADAVIST)
Comparison: CT chest [DATE]

CLINICAL DATA: Metastatic lung cancer.

EXAM:
MRI THORACIC WITHOUT AND WITH CONTRAST
TECHNIQUE: Multiplanar and multiecho pulse sequences of the thoracic spine were
obtained without and with intravenous contrast.
CONTRAST:  7mL GADAVIST GADOBUTROL 1 MMOL/ML IV SOLN

[Series 45: T1 · sagittal · 4.0mm · 1.72mm/px · 4 of 15 slices shown (1 of 3)]
[im 1/15]
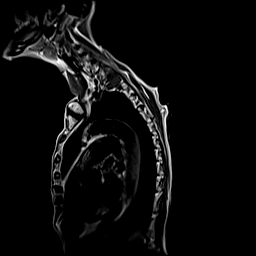
[im 5/15]
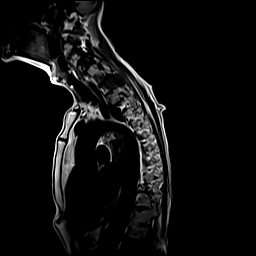
[im 10/15]
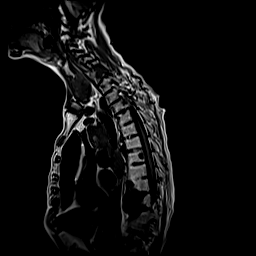
[im 15/15]
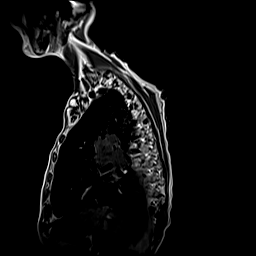

[Series 46: STIR · sagittal · 3.0mm · 1.00mm/px · 3 of 17 slices shown]
[im 1/17]
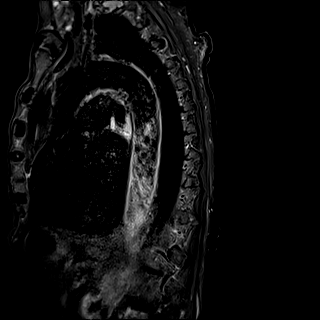
[im 9/17]
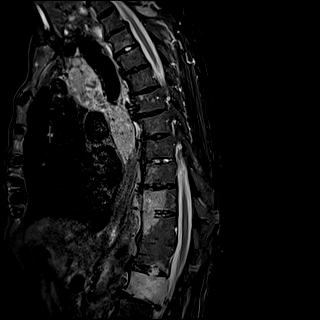
[im 17/17]
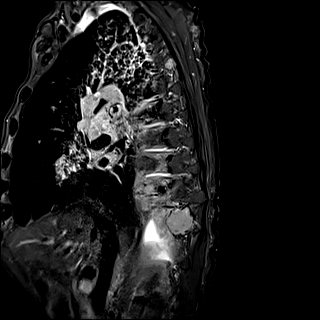

[Series 47: T1 · sagittal · 3.0mm · 1.00mm/px · 3 of 17 slices shown (2 of 3)]
[im 1/17]
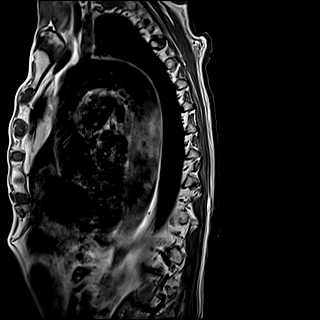
[im 9/17]
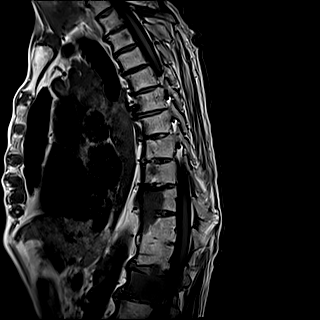
[im 17/17]
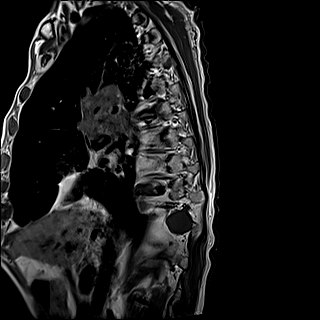

[Series 48: T2 · axial · 4.0mm · 0.78mm/px · z∈[-215,-3]mm · 8 of 39 slices shown]
[im 1/39]
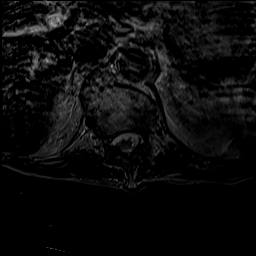
[im 6/39]
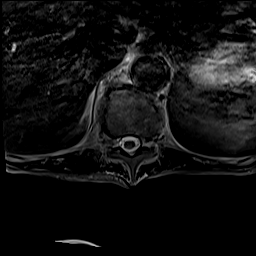
[im 11/39]
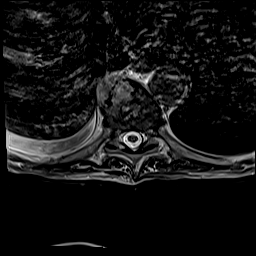
[im 17/39]
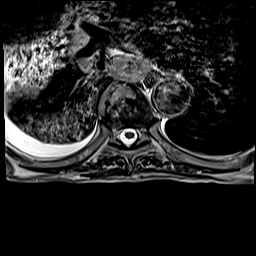
[im 22/39]
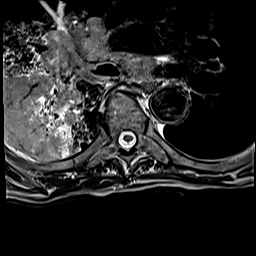
[im 28/39]
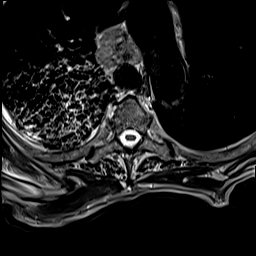
[im 33/39]
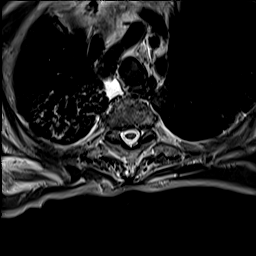
[im 39/39]
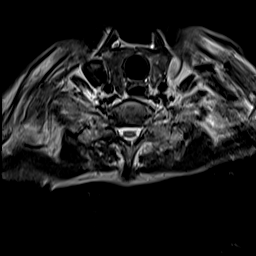

[Series 50: T1 · axial · 4.0mm · 0.39mm/px · z∈[-215,-2]mm · 8 of 39 slices shown (3 of 3)]
[im 1/39]
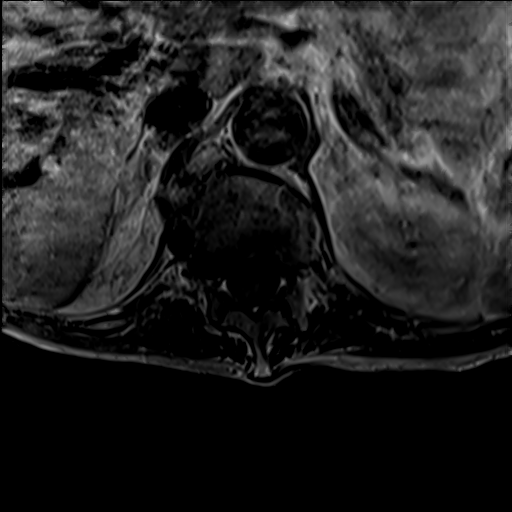
[im 6/39]
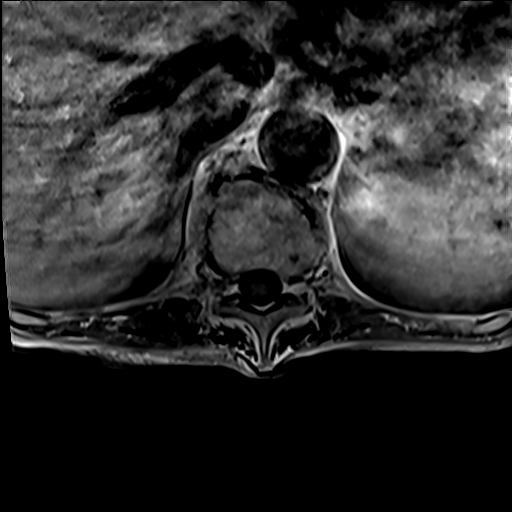
[im 11/39]
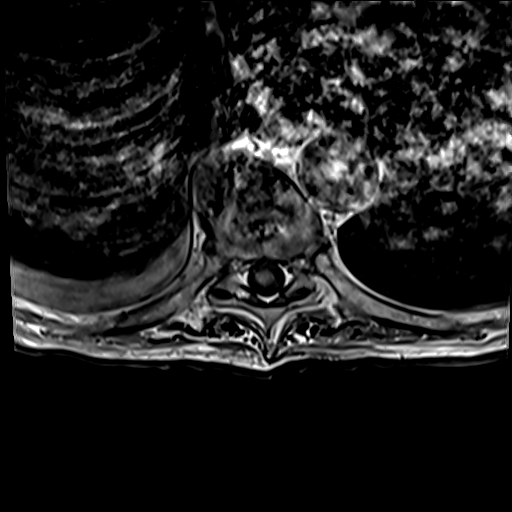
[im 17/39]
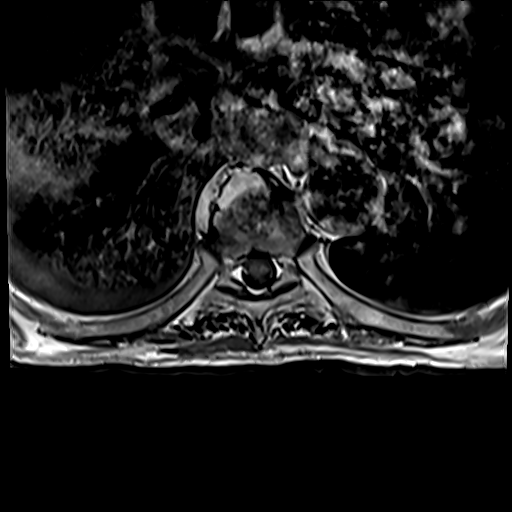
[im 22/39]
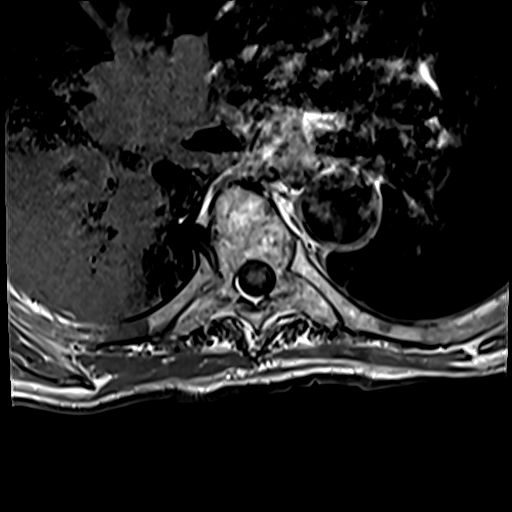
[im 28/39]
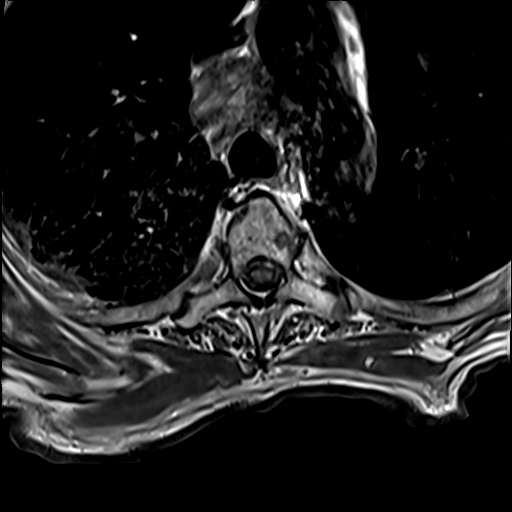
[im 33/39]
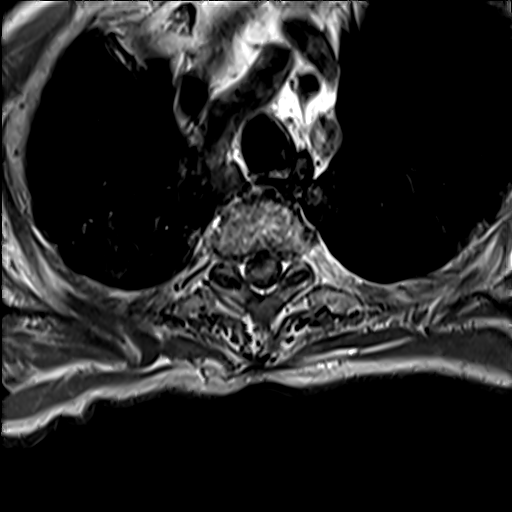
[im 39/39]
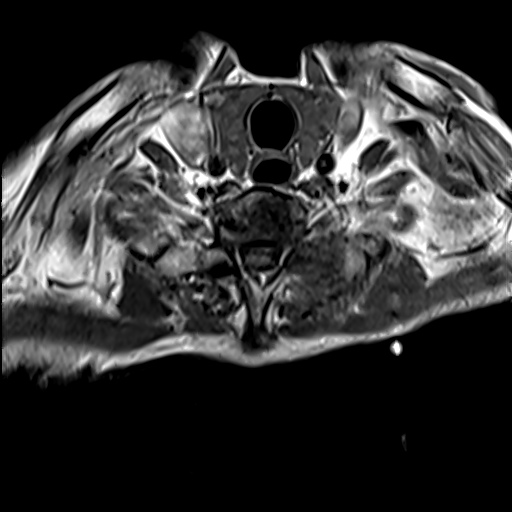

[Series 51: T2 post-contrast · sagittal · 3.0mm · 0.83mm/px · 3 of 17 slices shown]
[im 1/17]
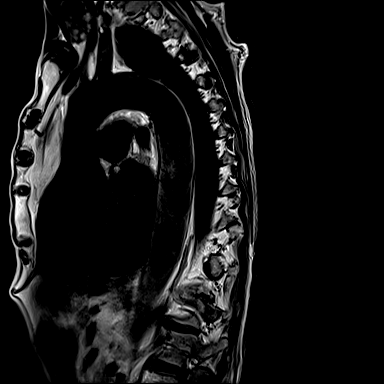
[im 9/17]
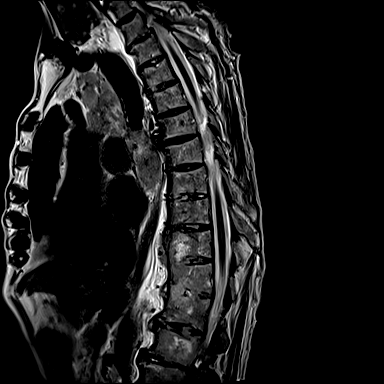
[im 17/17]
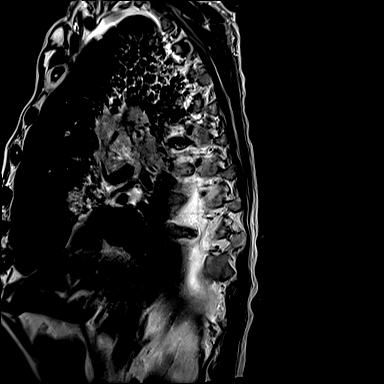

[Series 52: T1 fat-sat post-contrast · sagittal · 3.0mm · 1.00mm/px · 3 of 17 slices shown]
[im 1/17]
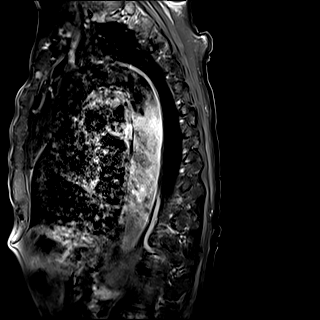
[im 9/17]
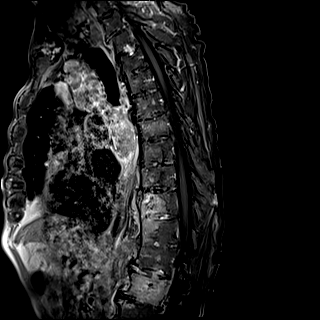
[im 17/17]
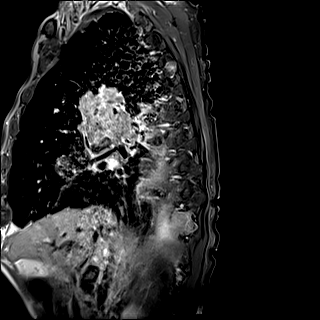

[Series 53: T1 post-contrast · axial · 4.0mm · 0.39mm/px · 1 of 39 slices shown]
[im 1/39]
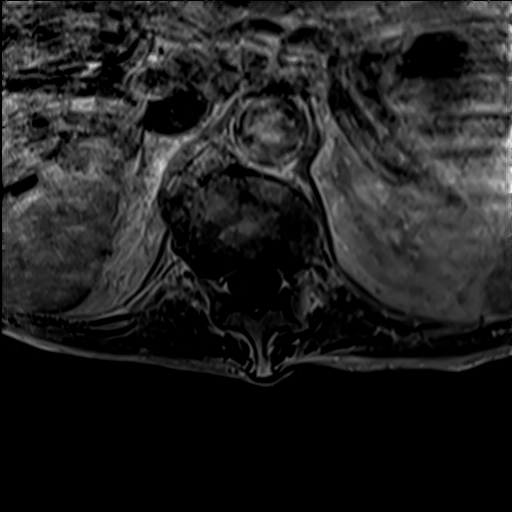

[33 of 48 positions shown; findings below may reference images not displayed]

FINDINGS: MRI THORACIC SPINE FINDINGS

Alignment:  Physiologic.

Vertebrae: T1 hypointense, T2 hyperintense enhancing lesions are
seen scattered throughout the thoracic spine, consistent with
metastatic disease. The largest lesions involve the body, left
pedicle and posterior elements at T1, body, right pedicle and
posterior elements at T3, anterior aspect of the T9 through T11
vertebral bodies, right costovertebral junction at T11 and body and
right pedicle of L1. The L1 lesion involves the posterior wall with
mild retropulsion into the spinal canal causing at least mild spinal
canal narrowing. This is only partially evaluated in the current
study. No significant retropulsion or involvement of the posterior
wall at the thoracic level.

Localizer images also show C5 and C7 vertebral body lesions.
Although limited images were obtained, there appears to be at least
mild encroachment on the spinal canal at the C5 level.

Cord:  Normal signal and morphology.

Paraspinal and other soft tissues: Mediastinal, right hilar and lung
masses are better described on recent CT angiogram of the chest.
Small right pleural effusion.

Disc levels:

T1-2: Mass lesion previously described obliterate the left neural
foramen. There is small on extension into the left lateral epidural
fat. No significant compression on the thecal sac.

T2-3: Posterior disc protrusion. No significant spinal canal or
neural foraminal stenosis.

T3-4 through T12-L1: No spinal canal or neural foraminal stenosis.

L1-2: Partially visualized. There appears to be a least mild spinal
canal stenosis and moderate right neural foraminal narrowing.
IMPRESSION: 1. Diffuse osseous metastatic disease involving the thoracic spine
as above. The largest lesions are seen at T1, T3, T9, T11, and L1.
There is mild retropulsion into the spinal canal at L1 only
partially evaluated, causing at least mild spinal canal narrowing.
2. Mass lesion obliterates the left T1-2 neural foramen. There is
small extension into the left lateral epidural fat without
significant compression of the thecal sac.
3. Small right pleural effusion.
4. Mediastinal, right hilar and lung masses are better described on
recent CT angiogram of the chest.
5. Limited views of the cervical spine shows C5 and C7 bone
metastases with involvement of the posterior wall in at least mild
retropulsion at C5.

## 2019-05-26 MED ORDER — GADOBUTROL 1 MMOL/ML IV SOLN
7.0000 mL | Freq: Once | INTRAVENOUS | Status: AC | PRN
  Administered 2019-05-26: 7 mL via INTRAVENOUS

## 2019-05-27 ENCOUNTER — Ambulatory Visit (HOSPITAL_COMMUNITY): Payer: Medicare HMO | Admitting: Hematology

## 2019-05-27 ENCOUNTER — Ambulatory Visit (HOSPITAL_COMMUNITY)
Admission: RE | Admit: 2019-05-27 | Discharge: 2019-05-27 | Disposition: A | Discharge status: Home / Self Care | Payer: Medicare HMO | Source: Ambulatory Visit | Attending: Hematology | Admitting: Hematology

## 2019-05-27 ENCOUNTER — Other Ambulatory Visit: Payer: Self-pay

## 2019-05-27 ENCOUNTER — Telehealth (HOSPITAL_COMMUNITY): Payer: Self-pay | Admitting: Surgery

## 2019-05-27 ENCOUNTER — Encounter (HOSPITAL_COMMUNITY): Payer: Self-pay

## 2019-05-27 DIAGNOSIS — Z87891 Personal history of nicotine dependence: Secondary | ICD-10-CM | POA: Insufficient documentation

## 2019-05-27 DIAGNOSIS — C7972 Secondary malignant neoplasm of left adrenal gland: Secondary | ICD-10-CM | POA: Insufficient documentation

## 2019-05-27 DIAGNOSIS — E119 Type 2 diabetes mellitus without complications: Secondary | ICD-10-CM | POA: Diagnosis not present

## 2019-05-27 DIAGNOSIS — R590 Localized enlarged lymph nodes: Secondary | ICD-10-CM | POA: Insufficient documentation

## 2019-05-27 DIAGNOSIS — R0781 Pleurodynia: Secondary | ICD-10-CM | POA: Insufficient documentation

## 2019-05-27 DIAGNOSIS — Z791 Long term (current) use of non-steroidal anti-inflammatories (NSAID): Secondary | ICD-10-CM | POA: Insufficient documentation

## 2019-05-27 DIAGNOSIS — R918 Other nonspecific abnormal finding of lung field: Secondary | ICD-10-CM | POA: Diagnosis not present

## 2019-05-27 DIAGNOSIS — Z7984 Long term (current) use of oral hypoglycemic drugs: Secondary | ICD-10-CM | POA: Insufficient documentation

## 2019-05-27 DIAGNOSIS — Z79899 Other long term (current) drug therapy: Secondary | ICD-10-CM | POA: Diagnosis not present

## 2019-05-27 DIAGNOSIS — C3491 Malignant neoplasm of unspecified part of right bronchus or lung: Secondary | ICD-10-CM | POA: Diagnosis present

## 2019-05-27 DIAGNOSIS — R160 Hepatomegaly, not elsewhere classified: Secondary | ICD-10-CM | POA: Insufficient documentation

## 2019-05-27 DIAGNOSIS — C787 Secondary malignant neoplasm of liver and intrahepatic bile duct: Secondary | ICD-10-CM | POA: Diagnosis not present

## 2019-05-27 DIAGNOSIS — R634 Abnormal weight loss: Secondary | ICD-10-CM | POA: Insufficient documentation

## 2019-05-27 LAB — COMPREHENSIVE METABOLIC PANEL WITH GFR
ALT: 15 U/L (ref 0–44)
AST: 24 U/L (ref 15–41)
Albumin: 2.7 g/dL — ABNORMAL LOW (ref 3.5–5.0)
Alkaline Phosphatase: 90 U/L (ref 38–126)
Anion gap: 13 (ref 5–15)
BUN: 10 mg/dL (ref 8–23)
CO2: 26 mmol/L (ref 22–32)
Calcium: 9.5 mg/dL (ref 8.9–10.3)
Chloride: 100 mmol/L (ref 98–111)
Creatinine, Ser: 0.62 mg/dL (ref 0.61–1.24)
GFR calc Af Amer: >60 mL/min
GFR calc non Af Amer: >60 mL/min
Glucose, Bld: 131 mg/dL — ABNORMAL HIGH (ref 70–99)
Potassium: 3.6 mmol/L (ref 3.5–5.1)
Sodium: 139 mmol/L (ref 135–145)
Total Bilirubin: 0.7 mg/dL (ref 0.3–1.2)
Total Protein: 6.3 g/dL — ABNORMAL LOW (ref 6.5–8.1)

## 2019-05-27 LAB — CBC WITH DIFFERENTIAL/PLATELET
Abs Immature Granulocytes: 0.04 10*3/uL (ref 0.00–0.07)
Basophils Absolute: 0.1 10*3/uL (ref 0.0–0.1)
Basophils Relative: 1 %
Eosinophils Absolute: 0 10*3/uL (ref 0.0–0.5)
Eosinophils Relative: 0 %
HCT: 44.4 % (ref 39.0–52.0)
Hemoglobin: 13.9 g/dL (ref 13.0–17.0)
Immature Granulocytes: 1 %
Lymphocytes Relative: 10 %
Lymphs Abs: 0.8 10*3/uL (ref 0.7–4.0)
MCH: 28.1 pg (ref 26.0–34.0)
MCHC: 31.3 g/dL (ref 30.0–36.0)
MCV: 89.9 fL (ref 80.0–100.0)
Monocytes Absolute: 0.4 10*3/uL (ref 0.1–1.0)
Monocytes Relative: 5 %
Neutro Abs: 7.1 10*3/uL (ref 1.7–7.7)
Neutrophils Relative %: 83 %
Platelets: 441 10*3/uL — ABNORMAL HIGH (ref 150–400)
RBC: 4.94 MIL/uL (ref 4.22–5.81)
RDW: 14.6 % (ref 11.5–15.5)
WBC: 8.4 10*3/uL (ref 4.0–10.5)
nRBC: 0 % (ref 0.0–0.2)

## 2019-05-27 LAB — PROTIME-INR
INR: 1 (ref 0.8–1.2)
Prothrombin Time: 13.3 seconds (ref 11.4–15.2)

## 2019-05-27 IMAGING — US US ABDOMEN LIMITED
1 series · 14 of 25 positions shown · non-contrast
Comparison: None.

CLINICAL DATA: 86-year-old male with suspected lung cancer
metastatic to the liver. Assess for visibility of small liver
lesions prior to planned ultrasound-guided biopsy.

EXAM:
ULTRASOUND ABDOMEN LIMITED RIGHT UPPER QUADRANT

[Series 1: us abdomen limited · 14 of 37 slices shown]
[im 1/37]
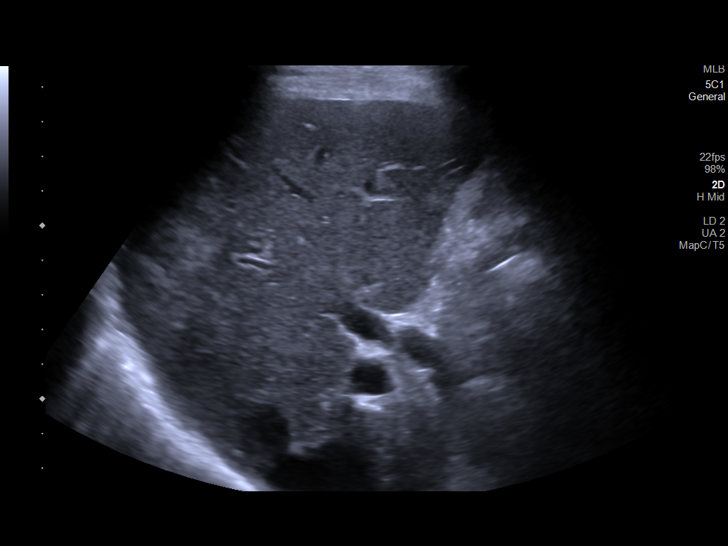
[im 4/37]
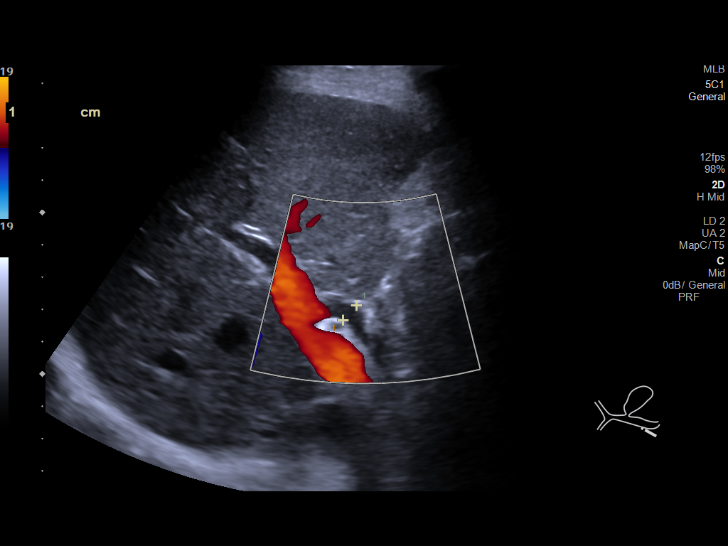
[im 7/37]
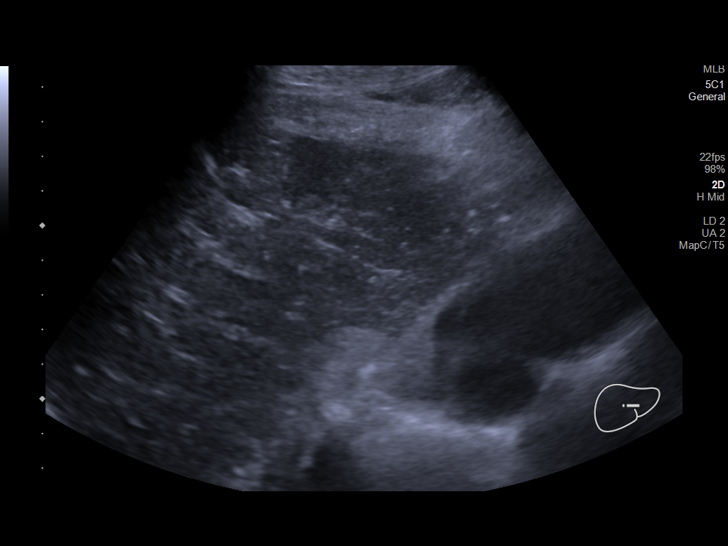
[im 10/37]
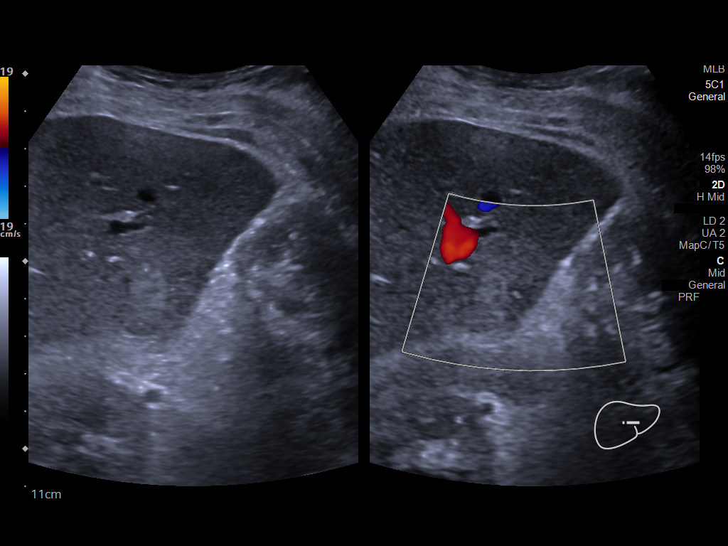
[im 13/37]
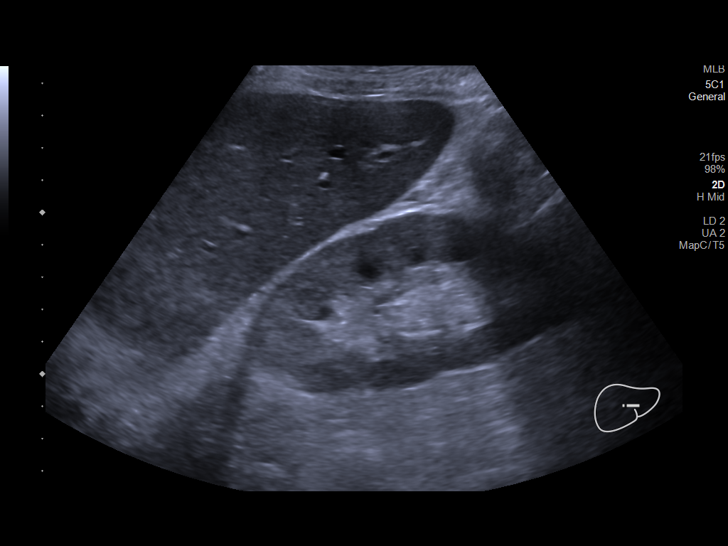
[im 14/37]
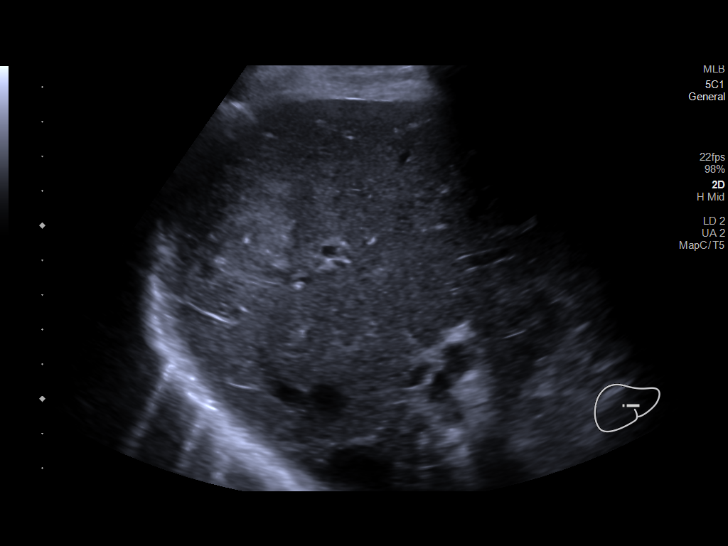
[im 17/37]
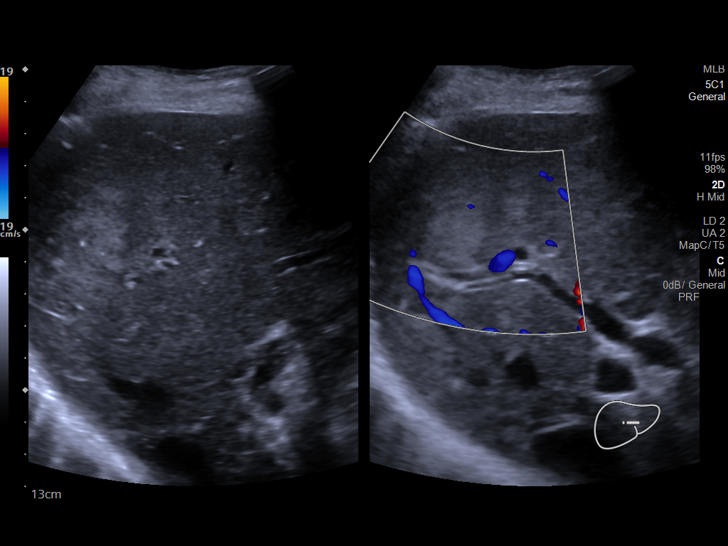
[im 20/37]
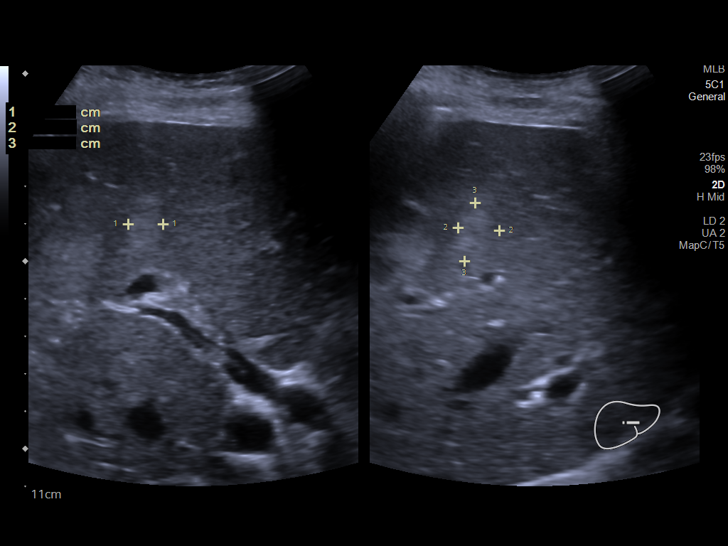
[im 23/37]
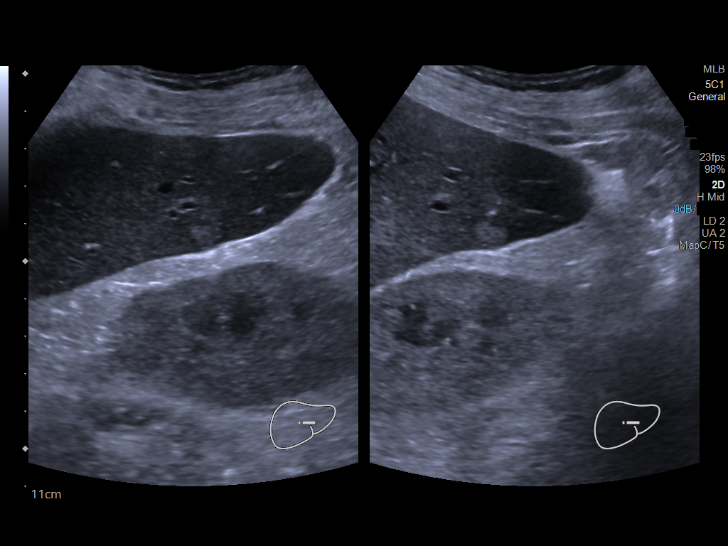
[im 25/37]
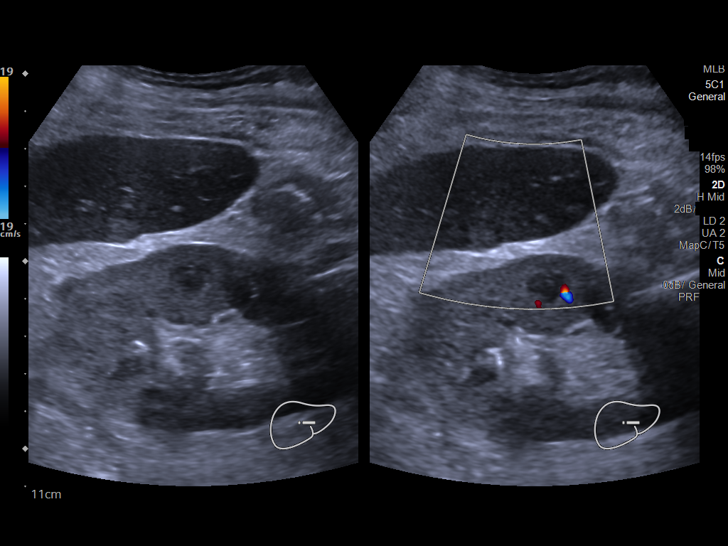
[im 28/37]
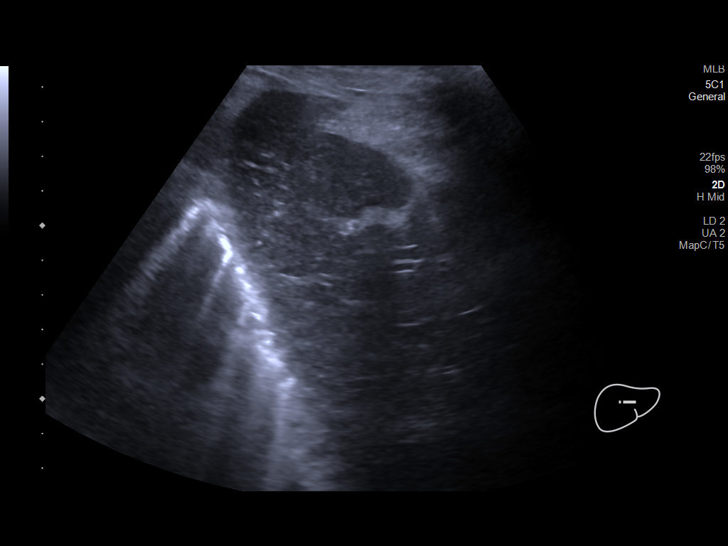
[im 31/37]
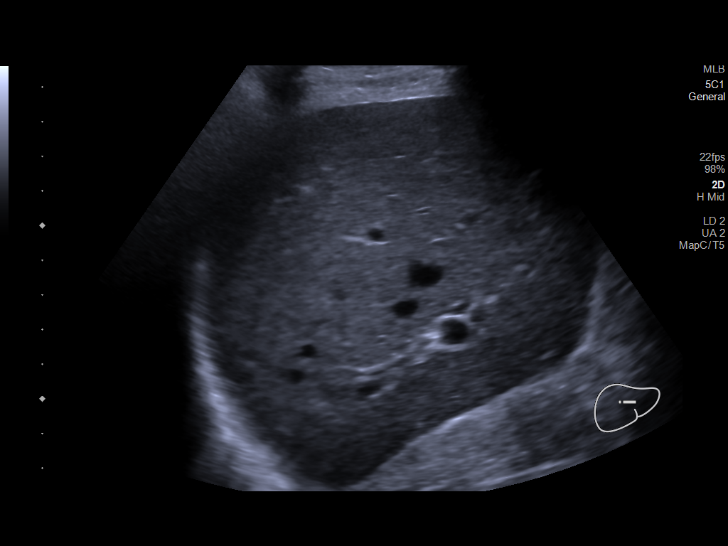
[im 34/37]
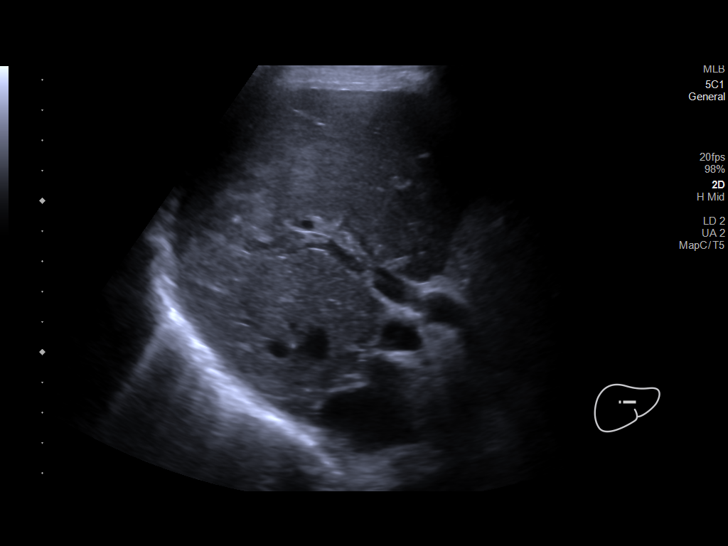
[im 37/37]
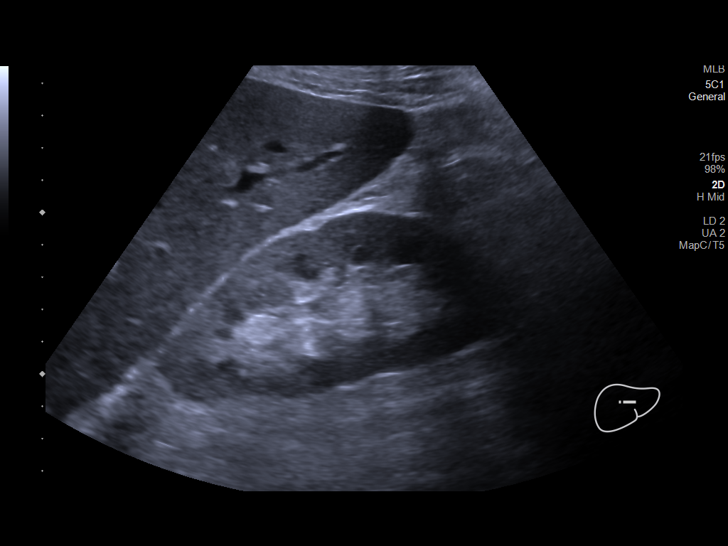

[14 of 25 positions shown; findings below may reference images not displayed]

FINDINGS: Gallbladder:

Surgically absent

Common bile duct:

Diameter: Normal at 6 mm

Liver:

Three solid echogenic lesions are identified in the liver. The
largest in the superior aspect of the right hemi liver measures
x 2.8 x 3.5 cm. There is a smaller adjacent satellite nodule
slightly more centrally within the liver which measures 0.9 x 1.1 x
1.6 cm. More inferiorly in the right lower lobe in the medial
subcapsular region is another 1.2 x 1.1 x 1.2 cm lesion. No biliary
ductal dilatation.

Other: None.
IMPRESSION: There are at least 3 solid slightly echogenic lesions scattered
throughout the right hemi liver concerning for metastatic disease.
At least 1 of the lesions should be amenable to ultrasound-guided
core biopsy.

## 2019-05-27 IMAGING — US US BIOPSY CORE LIVER
1 series · 13 of 16 positions shown · non-contrast
Comparison: none

INDICATION: 86-year-old male with liver lesions and a right upper lobe mass
concerning for lung cancer metastatic to the liver. Presents for
ultrasound-guided core biopsy to confirm tissue diagnosis.

[Series 1: us biopsy core liver · 13 of 16 slices shown]
[im 1/16]
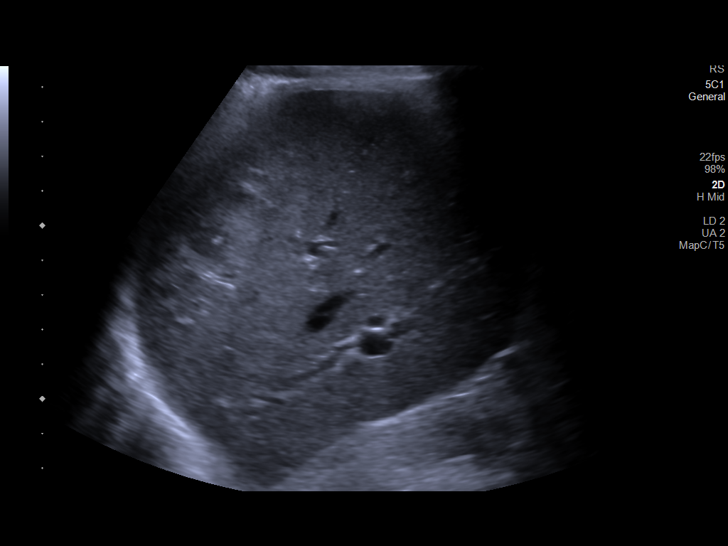
[im 2/16]
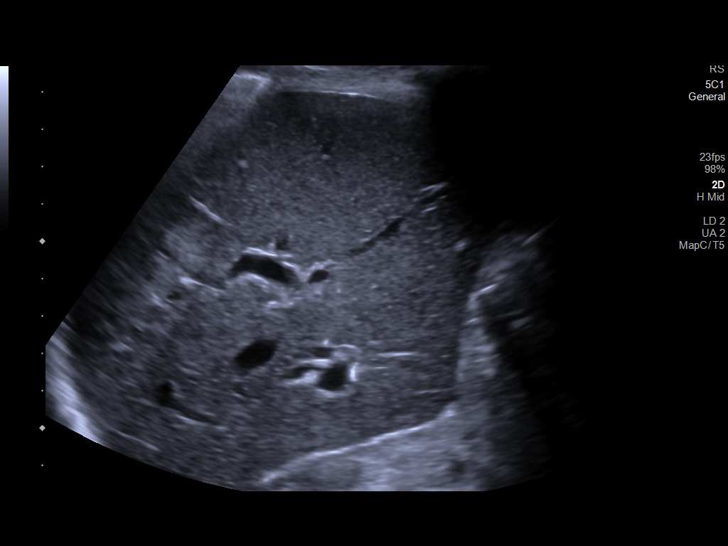
[im 4/16]
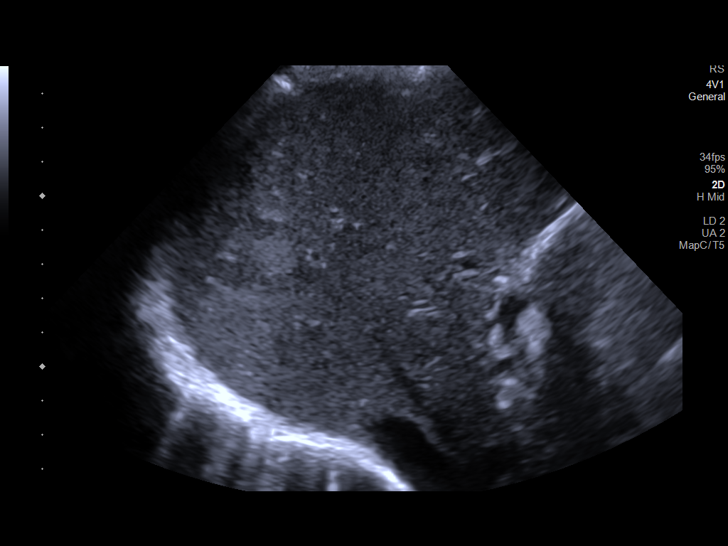
[im 5/16]
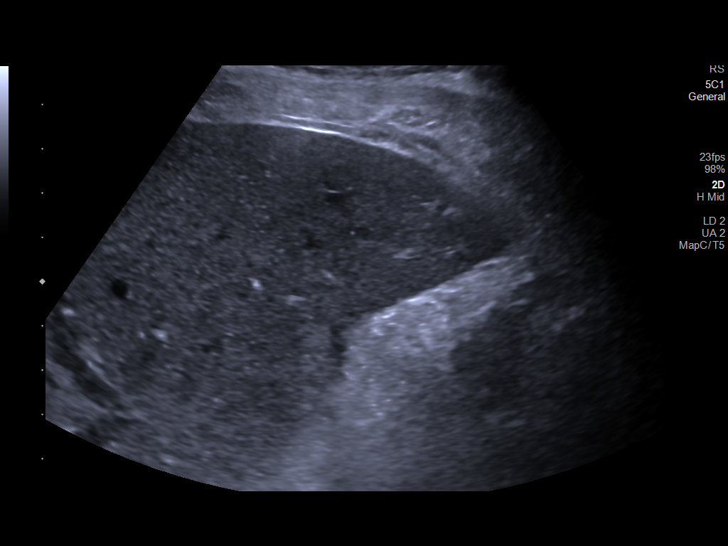
[im 6/16]
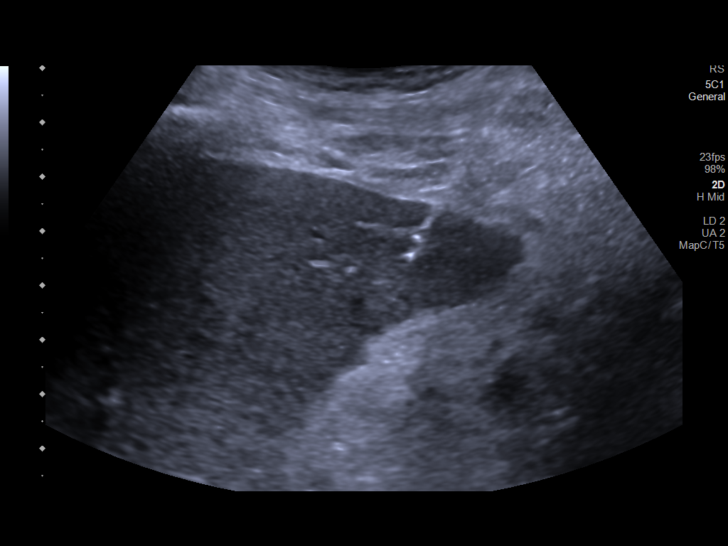
[im 7/16]
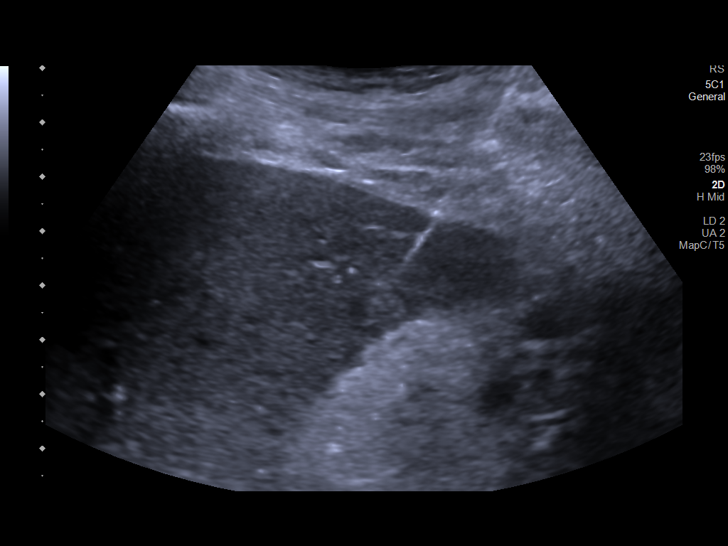
[im 9/16]
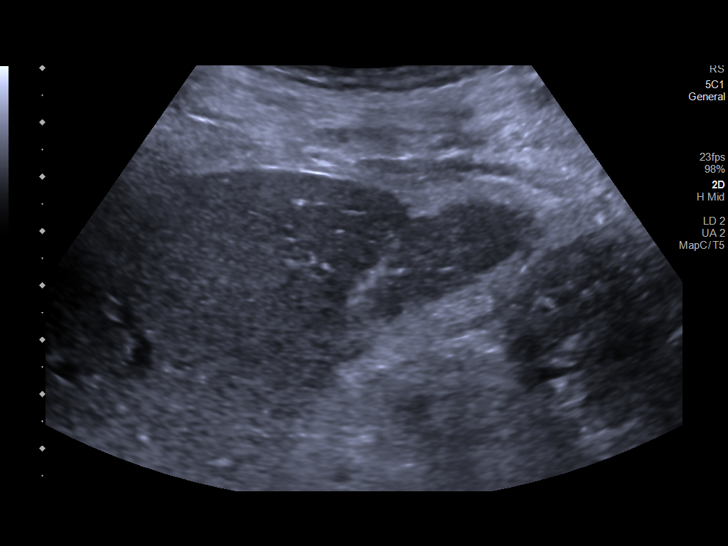
[im 10/16]
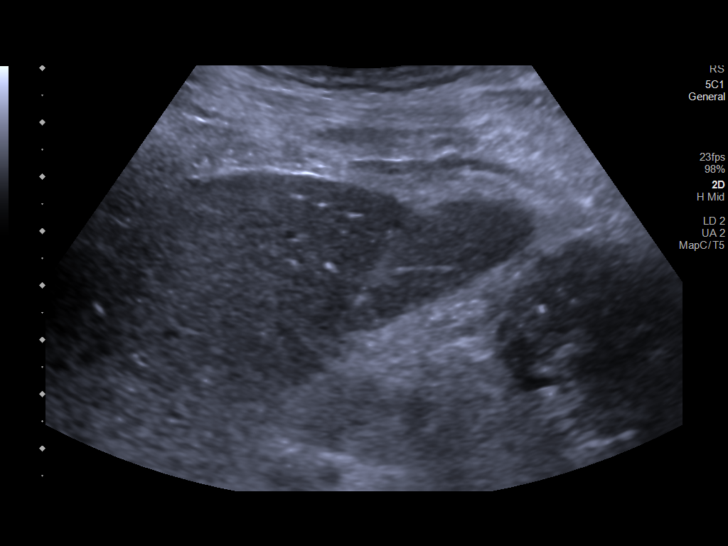
[im 11/16]
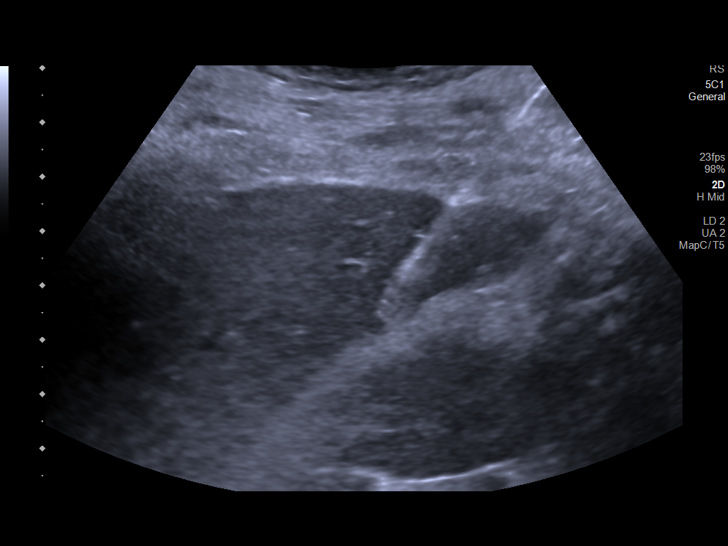
[im 12/16]
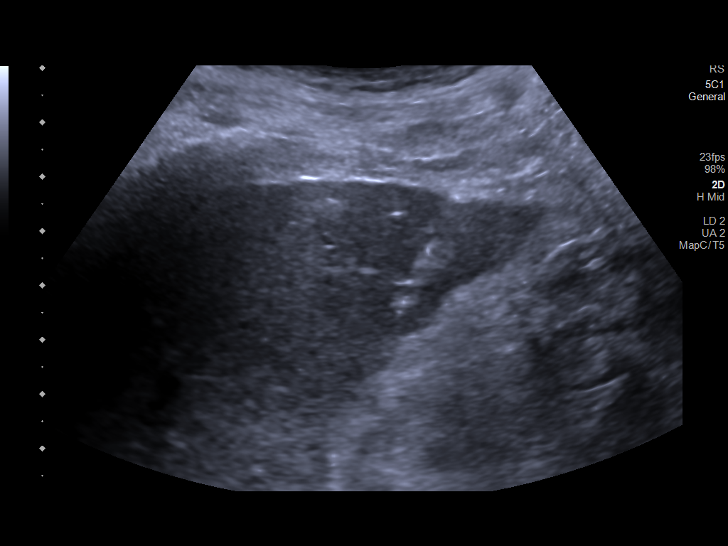
[im 13/16]
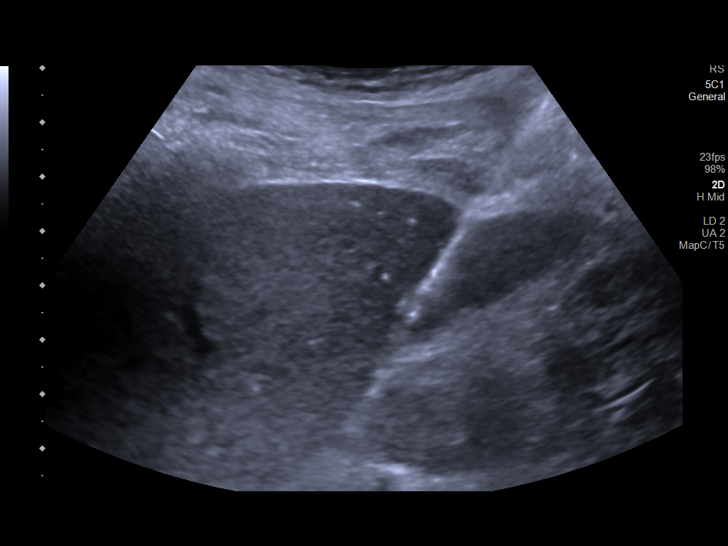
[im 15/16]
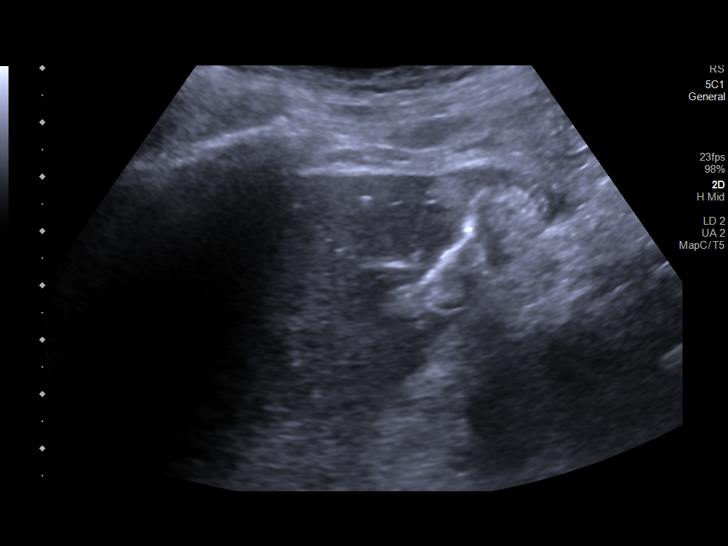
[im 16/16]
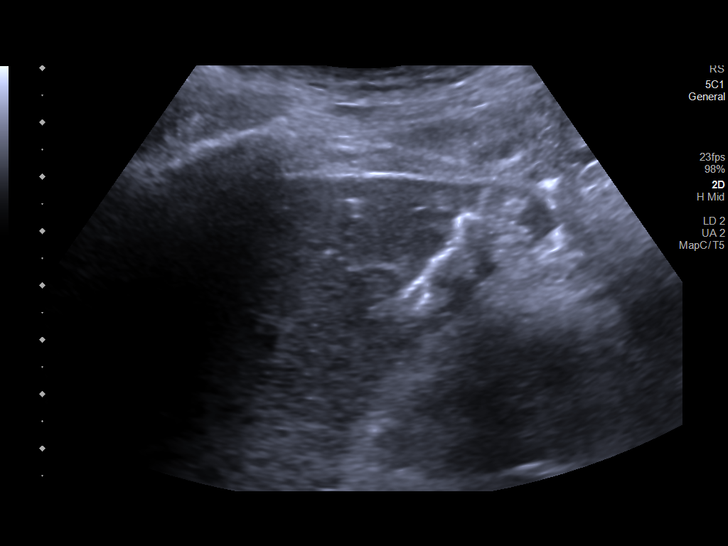

[13 of 16 positions shown; findings below may reference images not displayed]

EXAM:
ULTRASOUND BIOPSY CORE LIVER

MEDICATIONS:
None.

ANESTHESIA/SEDATION:
Moderate (conscious) sedation was employed during this procedure. A
total of Versed 1 mg and Fentanyl 50 mcg was administered
intravenously.

Moderate Sedation Time: 14 minutes. The patient's level of
consciousness and vital signs were monitored continuously by
radiology nursing throughout the procedure under my direct
supervision.

FLUOROSCOPY TIME:  None).

COMPLICATIONS:
None immediate.

PROCEDURE:
Informed written consent was obtained from the patient after a
thorough discussion of the procedural risks, benefits and
alternatives. All questions were addressed. Maximal Sterile Barrier
Technique was utilized including caps, mask, sterile gowns, sterile
gloves, sterile drape, hand hygiene and skin antiseptic. A timeout
was performed prior to the initiation of the procedure.

The liver was interrogated with ultrasound. The 1.1 cm lesion in the
subcapsular medial aspect of segment 6 was identified as the most
accessible biopsy target. The overlying skin was marked and then
sterilely prepped and draped in the standard fashion using
chlorhexidine skin prep. Local anesthesia was attained by
infiltration with 1% lidocaine. A small dermatotomy was made. Under
real-time ultrasound guidance, a 17 gauge introducer needle was
advanced and positioned at the margin of the lesion. Multiple 18
gauge core biopsies were then obtained coaxially using the BRACK TIGER
automated biopsy device. Biopsy specimens were placed in formalin
and delivered to pathology for further analysis. As the introducer
needle was removed, the biopsy tract was embolized with a Gel-Foam
slurry. Post biopsy ultrasound imaging demonstrates no evidence of
bleeding or complication.
IMPRESSION: Technically successful ultrasound-guided core biopsy of liver
lesion.

## 2019-05-27 MED ORDER — FENTANYL CITRATE (PF) 100 MCG/2ML IJ SOLN
INTRAMUSCULAR | Status: AC | PRN
  Administered 2019-05-27: 50 ug via INTRAVENOUS

## 2019-05-27 MED ORDER — LIDOCAINE HCL (PF) 1 % IJ SOLN
INTRAMUSCULAR | Status: AC
  Filled 2019-05-27: qty 30

## 2019-05-27 MED ORDER — MIDAZOLAM HCL 2 MG/2ML IJ SOLN
INTRAMUSCULAR | Status: AC | PRN
  Administered 2019-05-27: 1 mg via INTRAVENOUS

## 2019-05-27 MED ORDER — FENTANYL CITRATE (PF) 100 MCG/2ML IJ SOLN
INTRAMUSCULAR | Status: AC
  Filled 2019-05-27: qty 4

## 2019-05-27 MED ORDER — SODIUM CHLORIDE 0.9 % IV SOLN: INTRAVENOUS | Status: DC

## 2019-05-27 MED ORDER — GELATIN ABSORBABLE 12-7 MM EX MISC
CUTANEOUS | Status: AC
  Filled 2019-05-27: qty 1

## 2019-05-27 MED ORDER — MIDAZOLAM HCL 2 MG/2ML IJ SOLN
INTRAMUSCULAR | Status: AC
  Filled 2019-05-27: qty 4

## 2019-05-27 NOTE — Discharge Instructions (Signed)
Liver Biopsy  The liver is a large organ in the upper right side of the abdomen. A liver biopsy is a procedure in which a tissue sample is taken from the liver and examined under a microscope. There are three types of liver biopsies:  Percutaneous. A needle is used to remove a sample through an incision in your abdomen.  Laparoscopic. Several incisions are made in the abdomen. A sample is removed with the help of a tiny camera.  Transjugular. An incision is made in your neck in the area of the jugular vein. A sample is removed through a small flexible tube that is passed down the blood vessel and into your liver. Tell a health care provider about:  Any allergies you have.  All medicines you are taking, including vitamins, herbs, eye drops, creams, and over-the-counter medicines.  Any problems you or family members have had with anesthetic medicines.  Any blood disorders you have.  Any surgeries you have had.  Any medical conditions you have.  Whether you are pregnant or may be pregnant. What are the risks? Generally, this is a safe procedure. However, problems can occur and include:  Bleeding.  Infection.  Bruising.  Pain.  Injury to nearby organs or tissues, such as nerves, gallbladder, liver, or lungs. What happens before the procedure? Eating and drinking restrictions  You may be asked not to drink or eat for 6-8 hours before the liver biopsy. You may be allowed to eat a light breakfast. Talk to your health care provider about when you should stop eating and drinking. Medicines Ask your health care provider about:  Changing or stopping your regular medicines. This is especially important if you are taking diabetes medicines or blood thinners.  Taking medicines such as aspirin and ibuprofen. These medicines can thin your blood. Do not take these medicines unless your health care provider tells you to take them.  Taking over-the-counter medicines, vitamins, herbs, and  supplements. General instructions  Do not use any products that contain nicotine or tobacco, such as cigarettes and e-cigarettes. If you need help quitting, ask your health care provider.  Plan to have someone take you home from the hospital or clinic.  Plan to have a responsible adult care for you for at least 24 hours after you leave the hospital or clinic. This is important.  You may have blood or urine tests.  Ask your health care provider what steps will be taken to prevent infection. These may include: ? Removing hair at the surgery site. ? Washing skin with a germ-killing soap. ? Taking antibiotic medicine. What happens during the procedure?  An IV will be inserted into one of your veins. ? You will be given one or more of the following: ? A medicine to help you relax (sedative). ? A medicine to numb the area (local anesthetic). ? A medicine to make you fall asleep (general anesthetic).  Your health care provider will use one of the following procedures to remove samples from your liver. These procedures may vary among health care providers and hospitals. Percutaneous liver biopsy  You will lie on your back, with your right hand over your head.  A health care provider will locate your liver by tapping and pressing on the right side of your abdomen, or by using an ultrasound or CT scan.  A local anesthetic will be used to numb an area at the bottom of your last right rib.  A small incision will be made in the numbed area.    A biopsy needle will be inserted into the incision.  Several samples of liver tissue will be taken. You will be asked to hold your breath as each sample is taken.  The incision will be closed with stitches (sutures).  A bandage (dressing) may be placed over the incision. Laparoscopic liver biopsy  You will lie on your back.  Several small incisions will be made in your abdomen.  Your health care provider will pass a tiny camera through one  incision. The camera will allow the liver to be viewed on a TV monitor in the operating room.  Tools will be passed through the other incision or incisions.  Samples of the liver will be removed using the tools.  The incisions will be closed with stitches (sutures).  A bandage (dressing) may be placed over the incisions. Transjugular liver biopsy  You will lie on your back on an X-ray table, with your head turned to your left.  An area on your neck, just over your jugular vein, will be numbed.  An incision will be made in the numbed area.  A tiny tube will be inserted through the incision. The tube will be passed into the jugular vein to a blood vessel in the liver called the hepatic vein.  A dye will be injected through the tube.  X-rays will be taken. The dye will make the blood vessels in the liver light up on the X-rays.  The biopsy needle will be placed through the tube until it reaches the liver.  Samples of liver tissue will be taken with the biopsy needle.  The needle and the tube will be removed.  The incision will be closed with stitches (sutures).  A bandage (dressing) may be placed over the incision. What happens after the procedure?  Your blood pressure, heart rate, breathing rate, and blood oxygen level will be monitored until you leave the hospital or clinic.  You will be asked to rest quietly for 2-4 hours or longer.  You will be closely monitored for bleeding from the biopsy site.  You may be allowed to go home when the medicines have worn off and you can walk, drink, eat, and use the bathroom. Summary  A liver biopsy is a procedure in which a tissue sample is taken from the liver and examined under a microscope.  This is a safe procedure, but problems can occur, including bleeding, infection, pain, or injury to nearby organs or tissues.  Ask your health care provider about changing or stopping your regular medicines.  Plan to have someone take you  home from the hospital or clinic and to be with you for 24 hours after the procedure. This information is not intended to replace advice given to you by your health care provider. Make sure you discuss any questions you have with your health care provider. Document Revised: 03/22/2017 Document Reviewed: 03/22/2017 [employer] Patient Education  2020 [employer]  

## 2019-05-27 NOTE — Procedures (Signed)
Interventional Radiology Procedure Note  Procedure: US guided bx of liver lesion  Complications: None  Estimated Blood Loss: None  Recommendations: - Bedrest x 3 hrs - DC home  Signed,  Criselda Peaches, MD

## 2019-05-27 NOTE — H&P (Signed)
Chief Complaint: Metastatic lung cancer  Referring Physician(s): Katragadda,Sreedhar  Supervising Physician: Jacqulynn Cadet  Patient Status: [facility] - Out-pt  History of Present Illness: Dustin Shaw is a 86 y.o. male  to the ER on 05/10/2019 with generalized weakness and body pains.   He has developed new pain in the ribs, back and abdomen for the last 3 to 6 months.   He also reports unintentional weight loss of 30 pounds in the last 6 months.  CT of the chest showed right upper lobe lung mass with hilar and mediastinal adenopathy.  There was also lytic bone lesions in the ribs and thoracic vertebral bodies.    CT of the abdomen and pelvis showed metastatic disease to the liver and left adrenal gland along with lytic lesions to the L1 vertebral body and L5 vertebral body.    He is here today for US guided biopsy of a liver lesion for tissue diagnosis.  Past Medical History:  Diagnosis Date  . Arthritis   . BPH (benign prostatic hyperplasia)   . Diabetes mellitus without complication [facility])     Past Surgical History:  Procedure Laterality Date  . CHOLECYSTECTOMY      Allergies: Amoxicillin and Codeine  Medications: Prior to Admission medications   Medication Sig Start Date End Date Taking? Authorizing Provider  alum & mag hydroxide-simeth (MAALOX/MYLANTA) 200-200-20 MG/5ML suspension Take 15 mLs by mouth daily as needed for indigestion or heartburn.   Yes [provider]  calcium carbonate (TUMS - DOSED IN MG ELEMENTAL CALCIUM) 500 MG chewable tablet Chew 2 tablets by mouth 2 (two) times daily as needed for indigestion or heartburn.   Yes [provider]  celecoxib (CELEBREX) 200 MG capsule Take 200 mg by mouth daily. 04/28/19  Yes [provider]  cetirizine (ZYRTEC) 10 MG tablet Take 10 mg by mouth daily.   Yes [provider]  cholecalciferol (VITAMIN D3) 25 MCG (1000 UNIT) tablet Take 1,000 Units by mouth daily.   Yes  [provider]  digoxin (LANOXIN) 0.125 MG tablet Take 125 mcg by mouth daily. 04/30/19  Yes [provider]  metFORMIN (GLUCOPHAGE) 500 MG tablet Take 500 mg by mouth 2 (two) times daily. 04/16/19  Yes [provider]  Multiple Vitamin (MULTIVITAMIN WITH MINERALS) TABS tablet Take 1 tablet by mouth daily.   Yes [provider]  oxyCODONE-acetaminophen (PERCOCET) 10-325 MG tablet Take 1 tablet by mouth every 6 (six) hours as needed for pain. 05/15/19  Yes Derek Jack, MD  Polyvinyl Alcohol-Povidone (REFRESH OP) Place 1 drop into both eyes daily as needed (dry eyes).   Yes [provider]  tamsulosin (FLOMAX) 0.4 MG CAPS capsule Take 0.4 mg by mouth every evening.  03/18/19  Yes [provider]  traZODone (DESYREL) 50 MG tablet Take 50 mg by mouth in the morning and at bedtime. At dinner and bedtime 04/30/19  Yes [provider]     No family history on file.  Social History   Socioeconomic History  . Marital status: Married    Spouse name: Not on file  . Number of children: Not on file  . Years of education: Not on file  . Highest education level: Not on file  Occupational History  . Not on file  Tobacco Use  . Smoking status: Former Research scientist (life sciences)  . Smokeless tobacco: Current User    Types: Chew  Substance and Sexual Activity  . Alcohol use: Never  . Drug use: Never  .  Sexual activity: Not on file  Other Topics Concern  . Not on file  Social History Narrative  . Not on file   Social Determinants of Health   Financial Resource Strain: Low Risk   . Difficulty of Paying Living Expenses: Not very hard  Food Insecurity: No Food Insecurity  . Worried About Charity fundraiser in the Last Year: Never true  . Ran Out of Food in the Last Year: Never true  Transportation Needs: No Transportation Needs  . Lack of Transportation (Medical): No  . Lack of Transportation (Non-Medical): No  Physical Activity: Inactive  . Days  of Exercise per Week: 0 days  . Minutes of Exercise per Session: 0 min  Stress: No Stress Concern Present  . Feeling of Stress : Not at all  Social Connections: Slightly Isolated  . Frequency of Communication with Friends and Family: More than three times a week  . Frequency of Social Gatherings with Friends and Family: More than three times a week  . Attends Religious Services: More than 4 times per year  . Active Member of Clubs or Organizations: No  . Attends Archivist Meetings: Never  . Marital Status: Married     Review of Systems: A 12 point ROS discussed and pertinent positives are indicated in the HPI above.  All other systems are negative.  Review of Systems  Vital Signs: There were no vitals taken for this visit.  Physical Exam Vitals reviewed.  Constitutional:      Appearance: He is ill-appearing.     Comments: Elderly, frail  HENT:     Head: Normocephalic and atraumatic.  Eyes:     Extraocular Movements: Extraocular movements intact.  Cardiovascular:     Rate and Rhythm: Normal rate and regular rhythm.  Pulmonary:     Effort: Pulmonary effort is normal. No respiratory distress.     Breath sounds: Normal breath sounds.  Abdominal:     General: There is no distension.     Palpations: Abdomen is soft.     Tenderness: There is no abdominal tenderness.  Musculoskeletal:        General: Normal range of motion.     Cervical back: Normal range of motion.  Skin:    General: Skin is warm and dry.  Neurological:     General: No focal deficit present.     Mental Status: He is alert and oriented to person, place, and time.  Psychiatric:        Mood and Affect: Mood normal.        Behavior: Behavior normal.        Thought Content: Thought content normal.        Judgment: Judgment normal.     Imaging: DG Shoulder Right  Result Date: 05/10/2019 CLINICAL DATA:  Right shoulder pain EXAM: RIGHT SHOULDER - 2+ VIEW COMPARISON:  None. FINDINGS: Degenerative  changes are noted in the glenohumeral joint and acromioclavicular joint. Humeral head is somewhat high-riding likely related to chronic rotator cuff injury. No definitive fracture is seen. IMPRESSION: Degenerative change without acute abnormality. Electronically Signed   By: Inez Catalina M.D.   On: 05/10/2019 13:24   CT Angio Chest PE W/Cm &/Or Wo Cm  Result Date: 05/10/2019 CLINICAL DATA:  Short of breath, generalized body pain EXAM: CT ANGIOGRAPHY CHEST WITH CONTRAST TECHNIQUE: Multidetector CT imaging of the chest was performed using the standard protocol during bolus administration of intravenous contrast. Multiplanar CT image reconstructions and MIPs were obtained to  evaluate the vascular anatomy. CONTRAST:  171mL OMNIPAQUE IOHEXOL 350 MG/ML SOLN COMPARISON:  05/10/2019 FINDINGS: Cardiovascular: This is a technically adequate evaluation of the pulmonary vasculature. There are no filling defects or pulmonary emboli. There is trace pericardial effusion. The heart is not enlarged. Mild to moderate atherosclerosis of the aorta and coronary vessels. Mediastinum/Nodes: There is diffuse lymphadenopathy throughout the mediastinum and hila. Precarinal adenopathy measures up to 3.1 cm on image 43, subcarinal adenopathy measures up to 2.5 cm reference image 54, enlarged right hilar mass/adenopathy measures up to 4.1 cm reference image 47. Overall, the findings are consistent with bronchogenic right upper lobe neoplasm and mediastinal and ipsilateral hilar metastatic adenopathy. Lungs/Pleura: There is masslike consolidation within the right upper lobe, measuring 7.5 x 7.0 cm reference image 46. It is unclear whether this reflects underlying neoplasm or is postobstructive in nature, as there is abrupt cut off of the right upper lobe bronchus on image 47 adjacent to the right hilar mass described previously. PET-CT is recommended for further evaluation. Diffuse interlobular septal thickening throughout the right upper  lobe is suspicious for lymphangitic spread of disease. Bibasilar interstitial prominence likely reflects scarring and fibrosis. No effusion or pneumothorax. There is severe background emphysema. Upper Abdomen: Metastatic disease is seen within the liver. Please refer to corresponding CT abdomen report. Musculoskeletal: Lytic metastatic lesions are seen within the left anterior fourth rib, left anterolateral fifth rib and right posterior eleventh rib at the costovertebral junction. No pathologic fractures. There is relative lucency within the vertebral bodies from T8 through T12, metastatic disease not excluded. Lytic lesion within the superior aspect of the L1 vertebral body most likely reflects additional metastatic focus. Review of the MIP images confirms the above findings. IMPRESSION: 1. Masslike consolidation in the right upper lobe as well as a right hilar mass resulting in right upper lobe bronchial obstruction, consistent with bronchogenic malignancy. 2. Massive mediastinal and right hilar adenopathy consistent with nodal metastases. 3. Multifocal bony metastatic disease as above. 4. Interstitial prominence throughout the right upper lobe highly suspicious for lymphangitic spread of disease. 5. No evidence of pulmonary embolus. 6. Aortic Atherosclerosis (ICD10-I70.0) and Emphysema (ICD10-J43.9). Electronically Signed   By: Randa Ngo M.D.   On: 05/10/2019 15:49   MR BRAIN W WO CONTRAST  Result Date: 05/27/2019 CLINICAL DATA:  Metastatic lung cancer EXAM: MRI HEAD WITHOUT AND WITH CONTRAST TECHNIQUE: Multiplanar, multiecho pulse sequences of the brain and surrounding structures were obtained without and with intravenous contrast. CONTRAST:  81mL GADAVIST GADOBUTROL 1 MMOL/ML IV SOLN COMPARISON:  None. FINDINGS: Brain: No acute infarction, hemorrhage, hydrocephalus, extra-axial collection or mass lesion. Scattered foci of T2 hyperintensity are seen within the white matter of the cerebral hemispheres,  nonspecific, most likely related to chronic small vessel ischemia. There is mild prominence of the supratentorial ventricles and cerebral sulci reflecting parenchymal volume loss, age-appropriate. No focus of abnormal contrast enhancement seen to suggest metastatic disease to the brain. Vascular: Normal flow voids. Skull and upper cervical spine: Limited views of the cervical spine shows involvement of the C5 vertebral body with retropulsion of the posterior wall with mild encroachment on the spinal cord at this level. Sinuses/Orbits: Bilateral lens surgery. Visualized paranasal sinuses are clear. Other: None. IMPRESSION: 1. No evidence of intracranial metastatic disease. 2. Mild-to-moderate chronic white matter ischemic changes. 3. C5 vertebral body metastatic involvement with retropulsion of the posterior wall with mild encroachment on the spinal cord. Electronically Signed   By: Pedro Earls M.D.   On:  05/27/2019 09:45   MR Thoracic Spine W Wo Contrast  Result Date: 05/27/2019 CLINICAL DATA:  Metastatic lung cancer. EXAM: MRI THORACIC WITHOUT AND WITH CONTRAST TECHNIQUE: Multiplanar and multiecho pulse sequences of the thoracic spine were obtained without and with intravenous contrast. CONTRAST:  53mL GADAVIST GADOBUTROL 1 MMOL/ML IV SOLN COMPARISON:  CT chest May 10, 2019 FINDINGS: MRI THORACIC SPINE FINDINGS Alignment:  Physiologic. Vertebrae: T1 hypointense, T2 hyperintense enhancing lesions are seen scattered throughout the thoracic spine, consistent with metastatic disease. The largest lesions involve the body, left pedicle and posterior elements at T1, body, right pedicle and posterior elements at T3, anterior aspect of the T9 through T11 vertebral bodies, right costovertebral junction at T11 and body and right pedicle of L1. The L1 lesion involves the posterior wall with mild retropulsion into the spinal canal causing at least mild spinal canal narrowing. This is only partially  evaluated in the current study. No significant retropulsion or involvement of the posterior wall at the thoracic level. Localizer images also show C5 and C7 vertebral body lesions. Although limited images were obtained, there appears to be at least mild encroachment on the spinal canal at the C5 level. Cord:  Normal signal and morphology. Paraspinal and other soft tissues: Mediastinal, right hilar and lung masses are better described on recent CT angiogram of the chest. Small right pleural effusion. Disc levels: T1-2: Mass lesion previously described obliterate the left neural foramen. There is small on extension into the left lateral epidural fat. No significant compression on the thecal sac. T2-3: Posterior disc protrusion. No significant spinal canal or neural foraminal stenosis. T3-4 through T12-L1: No spinal canal or neural foraminal stenosis. L1-2: Partially visualized. There appears to be a least mild spinal canal stenosis and moderate right neural foraminal narrowing. IMPRESSION: 1. Diffuse osseous metastatic disease involving the thoracic spine as above. The largest lesions are seen at T1, T3, T9, T11, and L1. There is mild retropulsion into the spinal canal at L1 only partially evaluated, causing at least mild spinal canal narrowing. 2. Mass lesion obliterates the left T1-2 neural foramen. There is small extension into the left lateral epidural fat without significant compression of the thecal sac. 3. Small right pleural effusion. 4. Mediastinal, right hilar and lung masses are better described on recent CT angiogram of the chest. 5. Limited views of the cervical spine shows C5 and C7 bone metastases with involvement of the posterior wall in at least mild retropulsion at C5. Electronically Signed   By: Pedro Earls M.D.   On: 05/27/2019 09:38   CT Abdomen Pelvis W Contrast  Result Date: 05/10/2019 CLINICAL DATA:  Generalized body pain, abnormal chest x-ray EXAM: CT ABDOMEN AND PELVIS  WITH CONTRAST TECHNIQUE: Multidetector CT imaging of the abdomen and pelvis was performed using the standard protocol following bolus administration of intravenous contrast. CONTRAST:  186mL OMNIPAQUE IOHEXOL 350 MG/ML SOLN COMPARISON:  05/10/2019, 12/17/2018 FINDINGS: Lower chest: Please refer to findings within the CT chest report describing bronchogenic malignancy and intrathoracic metastatic disease. Hepatobiliary: There are multiple liver hypodensities consistent with metastatic disease, largest in the right lobe reference image 10 measuring 1.8 x 1.9 cm. Gallbladder surgically absent. Pancreas: Unremarkable. No pancreatic ductal dilatation or surrounding inflammatory changes. Spleen: Normal in size without focal abnormality. Adrenals/Urinary Tract: Left adrenal thickening measures 16 mm, concerning for metastatic disease given chest CT findings. Right adrenals unremarkable. The kidneys enhance normally and symmetrically. Normal excretion of contrast into unremarkable bilateral pelvocaliceal structures. Bladder is unremarkable. Stomach/Bowel:  No bowel obstruction or ileus. No inflammatory changes. Vascular/Lymphatic: Aortic atherosclerosis. No enlarged abdominal or pelvic lymph nodes. Reproductive: Prostate is unremarkable. Other: No abdominal wall hernia or abnormality. No abdominopelvic ascites. Musculoskeletal: Lytic metastatic lesions are seen within the right superior aspect of the L1 vertebral body as well as the inferior aspect of the L5 vertebral body. I do not see any pathologic fracture. Reconstructed images demonstrate no additional findings. IMPRESSION: 1. Metastatic disease within the liver, left adrenal gland, and lumbar spine. Please refer to CT chest report describing primary lung cancer and intrathoracic metastases. Electronically Signed   By: Randa Ngo M.D.   On: 05/10/2019 15:53   DG Chest Portable 1 View  Result Date: 05/10/2019 CLINICAL DATA:  Generalized body pain and dyspnea  EXAM: PORTABLE CHEST 1 VIEW COMPARISON:  None. FINDINGS: Cardiac shadows within normal limits. Aortic calcifications are seen. Vascular congestion is noted as well as diffuse right upper lobe infiltrate consistent with acute pneumonia. No bony abnormality is noted. IMPRESSION: Diffuse right upper lobe infiltrate. Mild vascular congestion is noted as well. Electronically Signed   By: Inez Catalina M.D.   On: 05/10/2019 13:23    Labs:  CBC: Recent Labs    05/10/19 1251  WBC 7.9  HGB 14.8  HCT 46.6  PLT 420*    COAGS: No results for input(s): INR, APTT in the last 8760 hours.  BMP: Recent Labs    05/10/19 1251  NA 136  K 3.3*  CL 100  CO2 26  GLUCOSE 130*  BUN 8  CALCIUM 9.2  CREATININE 0.49*  GFRNONAA >60  GFRAA >60    LIVER FUNCTION TESTS: Recent Labs    05/10/19 1251  BILITOT 0.7  AST 17  ALT 12  ALKPHOS 92  PROT 7.3  ALBUMIN 3.4*    TUMOR MARKERS: No results for input(s): AFPTM, CEA, CA199, CHROMGRNA in the last 8760 hours.  Assessment and Plan:  Metastatic disease to the liver.  Will proceed with image guided biopsy today by Dr. Laurence Ferrari.  Risks and benefits of image guided biopsy was discussed with the patient and/or patient's family including, but not limited to bleeding, infection, damage to adjacent structures or low yield requiring additional tests.  All of the questions were answered and there is agreement to proceed.  Consent signed and in chart.  Thank you for this interesting consult.  I greatly enjoyed meeting Alexzavier Girardin and look forward to participating in their care.  A copy of this report was sent to the requesting provider on this date.  Electronically Signed: Murrell Redden, PA-C   05/27/2019, 10:15 AM      I spent a total of  30 Minutes in face to face in clinical consultation, greater than 50% of which was counseling/coordinating care for liver lesion biopsy.

## 2019-05-27 NOTE — Telephone Encounter (Signed)
Pt's wife had left a message stating that the pt had tried ensure as Dr. Delton Coombes recommended, but that the pt was having a lot of bloating with ensure.  I left her a voicemail to get her husband to try boost.  I told her to call our office back if he was still having problems with the boost or if they had any more questions.

## 2019-05-31 LAB — SURGICAL PATHOLOGY

## 2019-06-01 ENCOUNTER — Other Ambulatory Visit (HOSPITAL_COMMUNITY): Payer: Self-pay | Admitting: *Deleted

## 2019-06-01 ENCOUNTER — Encounter (INDEPENDENT_AMBULATORY_CARE_PROVIDER_SITE_OTHER): Payer: Medicare HMO | Admitting: Ophthalmology

## 2019-06-01 DIAGNOSIS — C3491 Malignant neoplasm of unspecified part of right bronchus or lung: Secondary | ICD-10-CM

## 2019-06-01 MED ORDER — OXYCODONE HCL 10 MG PO TABS: 10.0000 mg | ORAL_TABLET | ORAL | 0 refills | Status: DC | PRN

## 2019-06-02 ENCOUNTER — Ambulatory Visit (HOSPITAL_COMMUNITY): Payer: Medicare HMO | Admitting: Hematology

## 2019-06-02 ENCOUNTER — Other Ambulatory Visit (HOSPITAL_COMMUNITY): Payer: Self-pay | Admitting: *Deleted

## 2019-06-02 ENCOUNTER — Other Ambulatory Visit: Payer: Self-pay

## 2019-06-02 ENCOUNTER — Inpatient Hospital Stay (HOSPITAL_COMMUNITY): Payer: Medicare HMO | Attending: Hematology | Admitting: Hematology

## 2019-06-02 ENCOUNTER — Encounter (HOSPITAL_COMMUNITY): Payer: Self-pay | Admitting: Hematology

## 2019-06-02 DIAGNOSIS — Z87891 Personal history of nicotine dependence: Secondary | ICD-10-CM | POA: Insufficient documentation

## 2019-06-02 DIAGNOSIS — C3490 Malignant neoplasm of unspecified part of unspecified bronchus or lung: Secondary | ICD-10-CM | POA: Diagnosis not present

## 2019-06-02 DIAGNOSIS — Z5111 Encounter for antineoplastic chemotherapy: Secondary | ICD-10-CM | POA: Insufficient documentation

## 2019-06-02 DIAGNOSIS — C787 Secondary malignant neoplasm of liver and intrahepatic bile duct: Secondary | ICD-10-CM | POA: Diagnosis not present

## 2019-06-02 DIAGNOSIS — Z79899 Other long term (current) drug therapy: Secondary | ICD-10-CM | POA: Diagnosis not present

## 2019-06-02 DIAGNOSIS — Z7189 Other specified counseling: Secondary | ICD-10-CM | POA: Insufficient documentation

## 2019-06-02 DIAGNOSIS — E119 Type 2 diabetes mellitus without complications: Secondary | ICD-10-CM | POA: Diagnosis not present

## 2019-06-02 DIAGNOSIS — C3491 Malignant neoplasm of unspecified part of right bronchus or lung: Secondary | ICD-10-CM

## 2019-06-02 DIAGNOSIS — Z7984 Long term (current) use of oral hypoglycemic drugs: Secondary | ICD-10-CM | POA: Insufficient documentation

## 2019-06-02 MED ORDER — PROCHLORPERAZINE MALEATE 10 MG PO TABS: 10.0000 mg | ORAL_TABLET | Freq: Four times a day (QID) | ORAL | 1 refills | Status: AC | PRN

## 2019-06-02 NOTE — Patient Instructions (Signed)
[place] at [facility] Discharge Instructions  You were seen today by Dr. Delton Coombes. He went over your recent lab and test results. You have stage 4 small cell lung cancer, this is an aggressive type of lung cancer. We can treat your cancer but not cure it. He discussed your treatment, which is a combined treatment of chemotherapy and immunotherapy. He will schedule you for a port-a-cath placement. He will see you back in 1 week for labs and follow up.   Thank you for choosing [place] at [facility] to provide your oncology and hematology care.  To afford each patient quality time with our provider, please arrive at least 15 minutes before your scheduled appointment time.   If you have a lab appointment with the [place] please come in thru the  Main Entrance and check in at the main information desk  You need to re-schedule your appointment should you arrive 10 or more minutes late.  We strive to give you quality time with our providers, and arriving late affects you and other patients whose appointments are after yours.  Also, if you no show three or more times for appointments you may be dismissed from the clinic at the providers discretion.     Again, thank you for choosing [facility].  Our hope is that these requests will decrease the amount of time that you wait before being seen by our physicians.       _____________________________________________________________  Should you have questions after your visit to [facility], please contact our office at [phone] between the hours of 8:00 a.m. and 4:30 p.m.  Voicemails left after 4:00 p.m. will not be returned until the following business day.  For prescription refill requests, have your pharmacy contact our office and allow 72 hours.    Cancer Center Support Programs:   > Cancer Support Group  2nd Tuesday of the month 1pm-2pm, Journey Room

## 2019-06-02 NOTE — Progress Notes (Signed)
I met with patient during the visit with Dr. Delton Coombes today. I provided written information to them on constipation as patient states that he is having trouble keeping his bowels moved. He reports having a decent bm yesterday but has always been the type that needs laxatives to help them move. His wife was provided a copy of written education on the chemotherapy medication Dr. Delton Coombes offered to him.  They were given the opportunity to ask questions and all were answered to their satisfaction.  They will return tomorrow for treatment and will get detailed education at bedside. They verbalize understanding.

## 2019-06-02 NOTE — Patient Instructions (Addendum)
[facility] Chemotherapy Teaching   You are diagnosed with extensive stage small cell lung cancer.  You will be treated on days 1-3 every 3 weeks with a combination of chemotherapy and immunotherapy drugs.  The drugs you will receive are atezolizumab (Tecentriq), etoposide, and carboplatin.  You will receive all three drugs on the first day of treatment, and then only etoposide on days 2 and 3.  This cycle will repeat every 3 weeks.  The intent of treatment is to control your cancer, prevent it from spreading further, and to help alleviate any symptoms you may be having related to your disease.  You will see the doctor regularly throughout treatment.  We will obtain blood work from you prior to every treatment and monitor your results to make sure it is safe to give your treatment. The doctor monitors your response to treatment by the way you are feeling, your blood work, and by obtaining scans periodically.  There will be wait times while you are here for treatment.  It will take about 30 minutes to 1 hour for your lab work to result.  Then there will be wait times while pharmacy mixes your medications.    Medications you will receive in the clinic prior to your chemotherapy medications:  Aloxi:  ALOXI is used in adults to help prevent nausea and vomiting that happens with certain chemotherapy drugs.  Aloxi is a long acting medication, and will remain in your system for about two days.   Emend:  This is an anti-nausea medication that is used with Aloxi to help prevent nausea and vomiting caused by chemotherapy.  Dexamethasone:  This is a steroid given prior to chemotherapy to help prevent allergic reactions; it may also help prevent and control nausea and diarrhea.    Atezolizumab Gildardo Pounds)  About This Drug Huey Bienenstock is used to treat cancer. It is given by the vein (IV).  The first infusion will take 60 minutes to infuse.  Subsequent infusions will take 30 minutes to infuse.    Possible Side Effects . Nausea . Tiredness and weakness . Decreased appetite (decreased hunger) . Cough . Trouble breathing  Note: Each of the side effects above was reported in 20% or greater of patients treated with atezolizumab. Your side effects may be different if you are taking atezolizumab in combination with another agent. Not all possible side effects are included above.  Warnings and Precautions . This drug works with your immune system and can cause inflammation (swelling) in any of your organs and tissues and can change how they work. This may put you at risk for developing serious medical problems, which can be life-threatening.  . Inflammation of the lungs which can be life-threatening. You may have a dry cough or trouble breathing.  . Severe changes in your liver function which can cause liver failure and be life-threatening.  . Colitis which is swelling in the colon. The symptoms are diarrhea, stomach cramping, and sometimes blood in the bowel movements.  . Changes in your central nervous system can happen. The central nervous system is made up of your brain and spinal cord. You could feel extreme tiredness, agitation, confusion, hallucinations (see or hear things that are not there), trouble understanding or speaking, loss of control of your bowels or bladder, eyesight changes, numbness or lack of strength to your arms, legs, face, or body, and coma. If you start to have any of these symptoms let your doctor know right away.  . This drug may  affect your hormone glands (thyroid, adrenals, pituitary and pancreas).  . Blood sugar levels may change, and you may develop diabetes. If you already have diabetes, changes may need to be made to your diabetes medication.  . Severe infections, including viral, bacterial and fungal, which can be life-threatening  . While you are getting this drug in your vein (IV), you may have a reaction to the drug. Sometimes you may be given  medication to stop or lessen these side effects. Your nurse will check you closely for these signs: fever or shaking chills, flushing, facial swelling, feeling dizzy, headache, trouble breathing, rash, itching, chest tightness, or chest pain. These reactions may happen after your infusion. If this happens, call 911 for emergency care.  Note: Some of the side effects above are very rare. If you have concerns and/or questions, please discuss them with your medical team.  Important Information . This drug may be present in the saliva, tears, sweat, urine, stool, vomit, semen, and vaginal secretions. Talk to your doctor and/or your nurse about the necessary precautions to take during this time.   Treating Side Effects  . Manage tiredness by pacing your activities for the day.  . Be sure to include periods of rest between energy-draining activities.  . To decrease the risk of infection, wash your hands regularly.  . Avoid close contact with people who have a cold, the flu, or other infections.  . Take your temperature as your doctor or nurse tells you, and whenever you feel like you may have a fever.  . Drink plenty of fluids (a minimum of eight glasses per day is recommended).  . Ask your doctor or nurse about medicine that is available to help stop or lessen loose bowel movements.  . To help with nausea, eat small, frequent meals instead of three large meals a day. Choose foods and drinks that are at room temperature. Ask your nurse or doctor about other helpful tips and medicine that is available to help stop or lessen these symptoms. . To help with decreased appetite, eat small, frequent meals. Eat foods high in calories and protein, such as meat, poultry, fish, dry beans, tofu, eggs, nuts, milk, yogurt, cheese, ice cream, pudding, and nutritional supplements.  . Consider using sauces and spices to increase taste. Daily exercise, with your doctor's approval, may increase your appetite.  .  If you have diabetes, keep good control of your blood sugar level. Tell your nurse or your doctor if your glucose levels are higher or lower than normal.  . Keeping your pain under control is important to your well-being. Please tell your doctor or nurse if you are experiencing pain.  . If you get a rash do not put anything on it unless your doctor or nurse says you may. Keep the area around the rash clean and dry. Ask your doctor for medicine if your rash bothers you.  . If you have numbness and tingling in your hands and feet, be careful when cooking, walking, and handling sharp objects and hot liquids.  . Infusion reactions may happen after your infusion. If this happens, call 911 for emergency care.  Food and Drug Interactions . There are no known interactions of atezolizumab with food.  . This drug may interact with other medicines. Tell your doctor and pharmacist about all the prescription and over-the-counter medicines and dietary supplements (vitamins, minerals, herbs and others) that you are taking at this time. Also, check with your doctor or pharmacist before starting any  new prescription or over-the-counter medicines, or dietary supplements to make sure that there are no interactions.  When to Call the Doctor  Call your doctor or nurse if you have any of these symptoms and/or any new or unusual symptoms:  . Fever of 100.4 F (38 C) or higher  . Chills  . Tiredness that interferes with your daily activities  . Feeling dizzy or lightheaded  . Pain in your chest  . Dry cough  . Coughing up yellow, green, or bloody mucus  . Wheezing or trouble breathing  . Feeling that your heart is beating in a fast or not normal way (palpitations)  . Confusion and/or agitation  . Hallucinations  . Trouble understanding or speaking  . Numbness or lack of strength to your arms, legs, face, or body  . Blurred vision or other changes in eyesight  . Diarrhea, 4 times in one day or  diarrhea with lack of strength or a feeling of being dizzy  . Pain in your abdomen that does not go away  . Blood in your stool  . Nausea that stops you from eating or drinking and/or is not relieved by prescribed medicines  . Throwing up  . Lasting loss of appetite or rapid weight loss of five pounds in a week  . Abnormal blood sugar  . Unusual thirst, passing urine often, headache, sweating, shakiness, irritability  . Pain that does not go away, or is not relieved by prescribed medicines  . Numbness, tingling, or pain your hands and feet  . Extreme weakness that interferes with normal activities  . A new rash or a rash that is not relieved by prescribed medicines  . Signs of infusion reaction: fever or shaking chills, flushing, facial swelling, feeling dizzy, headache, trouble breathing, rash, itching, chest tightness, or chest pain. If this happens, call 911 for emergency care.  . Signs of possible liver problems: dark urine, pale bowel movements, bad stomach pain, feeling very tired and weak, unusual itching, or yellowing of the eyes or skin  . If you think you may be pregnant  Reproduction Warnings  . Pregnancy warning: This drug can have harmful effects on the unborn baby. Women of childbearing potential should use effective methods of birth control during your cancer treatment and for at least 5 months after treatment. Let your doctor know right away if you think you may be pregnant.  . Breastfeeding warning: It is not known if this drug passes into breast milk. For this reason, Women should not breastfeed during treatment and for at least 5 months after treatment because this drug could enter the breast milk and cause harm to a breastfeeding baby.  . Fertility warning: In women, this drug may affect your ability to have children in the future. Talk with your doctor or nurse if you plan to have children. Ask for information on egg banking.  Carboplatin (Paraplatin,  CBDCA)  About This Drug  Carboplatin is used to treat cancer. It is given in the vein (IV).  This drug will take 30 minutes to infuse.   Possible Side Effects  . Bone marrow suppression. This is a decrease in the number of white blood cells, red blood cells, and platelets. This may raise your risk of infection, make you tired and weak (fatigue), and raise your risk of bleeding.  . Nausea and vomiting (throwing up)  . Weakness  . Changes in your liver function  . Changes in your kidney function  . Electrolyte changes  .  Pain  Note: Each of the side effects above was reported in 20% or greater of patients treated with carboplatin. Not all possible side effects are included above.  Warnings and Precautions  . Severe bone marrow suppression  . Allergic reactions, including anaphylaxis are rare but may happen in some patients. Signs of allergic reaction to this drug may be swelling of the face, feeling like your tongue or throat are swelling, trouble breathing, rash, itching, fever, chills, feeling dizzy, and/or feeling that your heart is beating in a fast or not normal way. If this happens, do not take another dose of this drug. You should get urgent medical treatment.  . Severe nausea and vomiting  . Effects on the nerves are called peripheral neuropathy. This risk is increased if you are over the age of 96 or if you have received other medicine with risk of peripheral neuropathy. You may feel numbness, tingling, or pain in your hands and feet. It may be hard for you to button your clothes, open jars, or walk as usual. The effect on the nerves may get worse with more doses of the drug. These effects get better in some people after the drug is stopped but it does not get better in all people.  Marland Kitchen Blurred vision, loss of vision or other changes in eyesight  . Decreased hearing   - Skin and tissue irritation including redness, pain, warmth, or swelling at the IV site if the drug leaks  out of the vein and into nearby tissue.  . Severe changes in your kidney function, which can cause kidney failure  . Severe changes in your liver function, which can cause liver failure  Note: Some of the side effects above are very rare. If you have concerns and/or questions, please discuss them with your medical team.  Important Information  . This drug may be present in the saliva, tears, sweat, urine, stool, vomit, semen, and vaginal secretions. Talk to your doctor and/or your nurse about the necessary precautions to take during this time.  Treating Side Effects  . Manage tiredness by pacing your activities for the day.  . Be sure to include periods of rest between energy-draining activities.  . To decrease the risk of infection, wash your hands regularly.  . Avoid close contact with people who have a cold, the flu, or other infections.  . Take your temperature as your doctor or nurse tells you, and whenever you feel like you may have a fever.  . To help decrease the risk of bleeding, use a soft toothbrush. Check with your nurse before using dental floss.  . Be very careful when using knives or tools.  . Use an electric shaver instead of a razor.  . Drink plenty of fluids (a minimum of eight glasses per day is recommended).  . If you throw up or have loose bowel movements, you should drink more fluids so that you do not become dehydrated (lack of water in the body from losing too much fluid).  . To help with nausea and vomiting, eat small, frequent meals instead of three large meals a day. Choose foods and drinks that are at room temperature. Ask your nurse or doctor about other helpful tips and medicine that is available to help stop or lessen these symptoms.  . If you have numbness and tingling in your hands and feet, be careful when cooking, walking, and handling sharp objects and hot liquids.  Marland Kitchen Keeping your pain under control is important to  your well-being. Please tell  your doctor or nurse if you are experiencing pain.  Food and Drug Interactions . There are no known interactions of carboplatin with food.  . This drug may interact with other medicines. Tell your doctor and pharmacist about all the prescription and over-the-counter medicines and dietary supplements (vitamins, minerals, herbs and others) that you are taking at this time. Also, check with your doctor or pharmacist before starting any new prescription or over-the-counter medicines, or dietary supplements to make sure that there are no interactions.  When to Call the Doctor  Call your doctor or nurse if you have any of these symptoms and/or any new or unusual symptoms:  . Fever of 100.4 F (38 C) or higher  . Chills  . Tiredness that interferes with your daily activities  . Feeling dizzy or lightheaded  . Easy bleeding or bruising  . Nausea that stops you from eating or drinking and/or is not relieved by prescribed medicines  . Throwing up  . Blurred vision or other changes in eyesight  . Decrease in hearing or ringing in the ear  . Signs of allergic reaction: swelling of the face, feeling like your tongue or throat are swelling, trouble breathing, rash, itching, fever, chills, feeling dizzy, and/or feeling that your heart is beating in a fast or not normal way. If this happens, call 911 for emergency care.  . Signs of possible liver problems: dark urine, pale bowel movements, bad stomach pain, feeling very tired and weak, unusual itching, or yellowing of the eyes or skin  . Decreased urine, or very dark urine  . Numbness, tingling, or pain in your hands and feet  . Pain that does not go away or is not relieved by prescribed medicine  . While you are getting this drug, please tell your nurse right away if you have any pain, redness, or swelling at the site of the IV infusion, or if you have any new onset of symptoms, or if you just feel "different" from before when the infusion  was started.   Reproduction Warnings  . Pregnancy warning: This drug may have harmful effects on the unborn baby. Women of child bearing potential should use effective methods of birth control during your cancer treatment. Let your doctor know right away if you think you may be pregnant.  . Breastfeeding warning: It is not known if this drug passes into breast milk. For this reason, women should not breastfeed during treatment because this drug could enter the breast milk and cause harm to a breastfeeding baby.  . Fertility warning: Human fertility studies have not been done with this drug. Talk with your doctor or nurse if you plan to have children. Ask for information on sperm or egg banking.  Etoposide  About This Drug  Etoposide is used to treat cancer. It is given in the vein (IV).  This drug will take 1 hour to infuse.    Possible Side Effects  . Bone marrow suppression. This is a decrease in the number of white blood cells, red blood cells, and platelets. This may raise your risk of infection, make you tired and weak (fatigue), and raise your risk of bleeding.  . Nausea and vomiting (throwing up)  . Hair loss. Hair loss is often temporary, although with certain medicine, hair loss can sometimes be permanent. Hair loss may happen suddenly or gradually. If you lose hair, you may lose it from your head, face, armpits, pubic area, chest, and/or legs. You  may also notice your hair getting thin.  Note: Each of the side effects above was reported in 20% or greater of patients treated with etoposide. Not all possible side effects are included above.  Warnings and Precautions  . Severe bone marrow suppression, which may be life-threatening  . This drug may raise your risk of getting a second cancer, such as leukemia  . Low blood pressure with rapid infusion of the medication  . Allergic reactions, including anaphylaxis are rare but may happen in some patients. Signs of allergic  reaction to this drug may be swelling of the face, feeling like your tongue or throat are swelling, trouble breathing, rash, itching, fever, chills, feeling dizzy, and/or feeling that your heart is beating in a fast or not normal way. If this happens, do not take another dose of this drug. You should get urgent medical treatment.  . These side effects may be more severe if you are receiving high doses of this medication included in pre-transplant chemotherapy.  Treating Side Effects  . Manage tiredness by pacing your activities for the day.  . Be sure to include periods of rest between energy-draining activities.  . To decrease the risk of infection, wash your hands regularly.  . Avoid close contact with people who have a cold, the flu, or other infections.  . Take your temperature as your doctor or nurse tells you, and whenever you feel like you may have a fever.  . To help decrease bleeding, use a soft toothbrush. Check with your nurse before using dental floss.  . Be very careful when using knives or tools.  . Use an electric shaver instead of a razor.  . Drink plenty of fluids (a minimum of eight glasses per day is recommended).  . If you throw up or have loose bowel movements, you should drink more fluids so that you do not become dehydrated (lack of water in the body from losing too much fluid).  . To help with nausea and vomiting, eat small, frequent meals instead of three large meals a day. Choose foods and drinks that are at room temperature. Ask your nurse or doctor about other helpful tips and medicine that is available to help or stop lessen these symptoms.  . To help with hair loss, wash with a mild shampoo and avoid washing your hair every day.  . Avoid rubbing your scalp, pat your hair or scalp dry.  . Avoid coloring your hair.  . Limit your use of hair spray, electric curlers, blow dryers, and curling irons.  . If you are interested in getting a wig, talk to your  nurse. You can also call the [place] at [phone] to find out information about the "Look Good, Feel Better" program close to where you live. It is a free program where women getting chemotherapy can learn about wigs, turbans and scarves as well as makeup techniques and skin and nail care.  Food and Drug Interactions  . There are no known interactions of etoposide with food.  . This drug may interact with other medicines. Tell your doctor and pharmacist about all the medicines and dietary supplements (vitamins, minerals, herbs and others) that you are taking at this time. The safety and use of dietary supplements and alternative diets are often not known. Using these might affect your cancer or interfere with your treatment. Until more is known, you should not use dietary supplements or alternative diets without your cancer doctor's help.  . There are  known interactions of etoposide with blood thinning medicine such as warfarin. Ask your doctor what precautions you should take.  When to Call the Doctor  Call your doctor or nurse if you have any of these symptoms and/or any new or unusual symptoms:  . Fever of 100.4 F (38 C) or higher  . Chills  . Tiredness that interferes with your daily activities  . Feeling dizzy or lightheaded  . Feeling that your heart is beating in a fast or not normal way (palpitations)  . Easy bleeding or bruising  . Nausea that stops you from eating or drinking and/or is not relieved by prescribed medicines  . Throwing up  . Signs of allergic reaction: swelling of the face, feeling like your tongue or throat are swelling, trouble breathing, rash, itching, fever, chills, feeling dizzy, and/or feeling that your heart is beating in a fast or not normal way  . If you think you may be pregnant or may have impregnated your partner  Reproduction Warnings  . Pregnancy warning: This drug can have harmful effects on the unborn baby. Women of  childbearing potential should use effective methods of birth control during your cancer treatment and for at least 6 months after treatment. Men with male partners of childbearing potential should use effective methods of birth control during your cancer treatment and for at least 4 months after your cancer treatment. Let your doctor know right away if you think you may be pregnant or may have impregnated your partner.  . Breastfeeding warning: It is not known if this drug passes into breast milk. For this reason, women should talk to their doctor about the risks and benefits of breastfeeding during treatment with this drug because this drug may enter the breast milk and cause harm to a breastfeeding baby.  . Fertility warning: In men and women both, this drug may affect your ability to have children in the future. Talk with your doctor or nurse if you plan to have children. Ask for information on sperm or egg banking.  Neulasta - this is a bone marrow stimulant you will receive after chemotherapy to help your body produce neutrophils (a type of white blood cell).  These white blood cells help your body fight infections. You will receive this medication via injection under the skin in your belly at least 24 hours after receiving chemotherapy.    SELF CARE ACTIVITIES WHILE RECEIVING CHEMOTHERAPY:  Hydration Increase your fluid intake 48 hours prior to treatment and drink at least 8 to 12 cups (64 ounces) of water/decaffeinated beverages per day after treatment. You can still have your cup of coffee or soda but these beverages do not count as part of your 8 to 12 cups that you need to drink daily. No alcohol intake.  Medications Continue taking your normal prescription medication as prescribed.  If you start any new herbal or new supplements please let us know first to make sure it is safe.  Mouth Care Have teeth cleaned professionally before starting treatment. Keep dentures and partial plates  clean. Use soft toothbrush and do not use mouthwashes that contain alcohol. Biotene is a good mouthwash that is available at most pharmacies or may be ordered by calling [phone]. Use warm salt water gargles (1 teaspoon salt per 1 quart warm water) before and after meals and at bedtime. If you need dental work, please let the doctor know before you go for your appointment so that we can coordinate the best possible time for  you in regards to your chemo regimen. You need to also let your dentist know that you are actively taking chemo. We may need to do labs prior to your dental appointment.  Skin Care Always use sunscreen that has not expired and with SPF (Sun Protection Factor) of 50 or higher. Wear hats to protect your head from the sun. Remember to use sunscreen on your hands, ears, face, & feet.  Use good moisturizing lotions such as udder cream, eucerin, or even Vaseline. Some chemotherapies can cause dry skin, color changes in your skin and nails.    . Avoid long, hot showers or baths. . Use gentle, fragrance-free soaps and laundry detergent. . Use moisturizers, preferably creams or ointments rather than lotions because the thicker consistency is better at preventing skin dehydration. Apply the cream or ointment within 15 minutes of showering. Reapply moisturizer at night, and moisturize your hands every time after you wash them.  Hair Loss (if your doctor says your hair will fall out)  . If your doctor says that your hair is likely to fall out, decide before you begin chemo whether you want to wear a wig. You may want to shop before treatment to match your hair color. . Hats, turbans, and scarves can also camouflage hair loss, although some people prefer to leave their heads uncovered. If you go bare-headed outdoors, be sure to use sunscreen on your scalp. . Cut your hair short. It eases the inconvenience of shedding lots of hair, but it also can reduce the emotional impact of watching your  hair fall out. . Don't perm or color your hair during chemotherapy. Those chemical treatments are already damaging to hair and can enhance hair loss. Once your chemo treatments are done and your hair has grown back, it's OK to resume dyeing or perming hair.  With chemotherapy, hair loss is almost always temporary. But when it grows back, it may be a different color or texture. In older adults who still had hair color before chemotherapy, the new growth may be completely gray.  Often, new hair is very fine and soft.  Infection Prevention Please wash your hands for at least 30 seconds using warm soapy water. Handwashing is the #1 way to prevent the spread of germs. Stay away from sick people or people who are getting over a cold. If you develop respiratory systems such as green/yellow mucus production or productive cough or persistent cough let us know and we will see if you need an antibiotic. It is a good idea to keep a pair of gloves on when going into grocery stores/[employer] to decrease your risk of coming into contact with germs on the carts, etc. Carry alcohol hand gel with you at all times and use it frequently if out in public. If your temperature reaches 100.5 or higher please call the clinic and let us know.  If it is after hours or on the weekend please go to the ER if your temperature is over 100.5.  Please have your own personal thermometer at home to use.    Sex and bodily fluids If you are going to have sex, a condom must be used to protect the person that isn't taking chemotherapy. Chemo can decrease your libido (sex drive). For a few days after chemotherapy, chemotherapy can be excreted through your bodily fluids.  When using the toilet please close the lid and flush the toilet twice.  Do this for a few day after you have had chemotherapy.  Effects of chemotherapy on your sex life Some changes are simple and won't last long. They won't affect your sex life permanently.  Sometimes you may  feel: . too tired . not strong enough to be very active . sick or sore  . not in the mood . anxious or low Your anxiety might not seem related to sex. For example, you may be worried about the cancer and how your treatment is going. Or you may be worried about money, or about how you family are coping with your illness.  These things can cause stress, which can affect your interest in sex. It's important to talk to your partner about how you feel.  Remember - the changes to your sex life don't usually last long. There's usually no medical reason to stop having sex during chemo. The drugs won't have any long term physical effects on your performance or enjoyment of sex. Cancer can't be passed on to your partner during sex  Contraception It's important to use reliable contraception during treatment. Avoid getting pregnant while you or your partner are having chemotherapy. This is because the drugs may harm the baby. Sometimes chemotherapy drugs can leave a man or woman infertile.  This means you would not be able to have children in the future. You might want to talk to someone about permanent infertility. It can be very difficult to learn that you may no longer be able to have children. Some people find counselling helpful. There might be ways to preserve your fertility, although this is easier for men than for women. You may want to speak to a fertility expert. You can talk about sperm banking or harvesting your eggs. You can also ask about other fertility options, such as donor eggs. If you have or have had breast cancer, your doctor might advise you not to take the contraceptive pill. This is because the hormones in it might affect the cancer. It is not known for sure whether or not chemotherapy drugs can be passed on through semen or secretions from the vagina. Because of this some doctors advise people to use a barrier method if you have sex during treatment. This applies to vaginal, anal or oral sex.  Generally, doctors advise a barrier method only for the time you are actually having the treatment and for about a week after your treatment. Advice like this can be worrying, but this does not mean that you have to avoid being intimate with your partner. You can still have close contact with your partner and continue to enjoy sex.  Animals If you have cats or birds we just ask that you not change the litter or change the cage.  Please have someone else do this for you while you are on chemotherapy.   Food Safety During and After Cancer Treatment Food safety is important for people both during and after cancer treatment. Cancer and cancer treatments, such as chemotherapy, radiation therapy, and stem cell/bone marrow transplantation, often weaken the immune system. This makes it harder for your body to protect itself from foodborne illness, also called food poisoning. Foodborne illness is caused by eating food that contains harmful bacteria, parasites, or viruses.  Foods to avoid Some foods have a higher risk of becoming tainted with bacteria. These include: Marland Kitchen Unwashed fresh fruit and vegetables, especially leafy vegetables that can hide dirt and other contaminants . Raw sprouts, such as alfalfa sprouts . Raw or undercooked beef, especially ground beef, or other raw or undercooked meat and  poultry . Fatty, fried, or spicy foods immediately before or after treatment.  These can sit heavy on your stomach and make you feel nauseous. . Raw or undercooked shellfish, such as oysters. . Sushi and sashimi, which often contain raw fish.  . Unpasteurized beverages, such as unpasteurized fruit juices, raw milk, raw yogurt, or cider . Undercooked eggs, such as soft boiled, over easy, and poached; raw, unpasteurized eggs; or foods made with raw egg, such as homemade raw cookie dough and homemade mayonnaise  Simple steps for food safety  Shop smart. . Do not buy food stored or displayed in an unclean  area. . Do not buy bruised or damaged fruits or vegetables. . Do not buy cans that have cracks, dents, or bulges. . Pick up foods that can spoil at the end of your shopping trip and store them in a cooler on the way home.  Prepare and clean up foods carefully. . Rinse all fresh fruits and vegetables under running water, and dry them with a clean towel or paper towel. . Clean the top of cans before opening them. . After preparing food, wash your hands for 20 seconds with hot water and soap. Pay special attention to areas between fingers and under nails. . Clean your utensils and dishes with hot water and soap. Marland Kitchen Disinfect your kitchen and cutting boards using 1 teaspoon of liquid, unscented bleach mixed into 1 quart of water.    Dispose of old food. . Eat canned and packaged food before its expiration date (the "use by" or "best before" date). . Consume refrigerated leftovers within 3 to 4 days. After that time, throw out the food. Even if the food does not smell or look spoiled, it still may be unsafe. Some bacteria, such as Listeria, can grow even on foods stored in the refrigerator if they are kept for too long.  Take precautions when eating out. . At restaurants, avoid buffets and salad bars where food sits out for a long time and comes in contact with many people. Food can become contaminated when someone with a virus, often a norovirus, or another "bug" handles it. . Put any leftover food in a "to-go" container yourself, rather than having the server do it. And, refrigerate leftovers as soon as you get home. . Choose restaurants that are clean and that are willing to prepare your food as you order it cooked.   AT HOME MEDICATIONS:                                                                                                                                                                Compazine/Prochlorperazine 10mg  tablet. Take 1 tablet every 6 hours as needed for nausea/vomiting.  (This can make you sleepy)   EMLA cream. Apply  a quarter size amount to port site 1 hour prior to chemo. Do not rub in. Cover with plastic wrap.    Diarrhea Sheet   If you are having loose stools/diarrhea, please purchase Imodium and begin taking as outlined:  At the first sign of poorly formed or loose stools you should begin taking Imodium (loperamide) 2 mg capsules.  Take two tablets (4mg ) followed by one tablet (2mg ) every 2 hours - DO NOT EXCEED 8 tablets in 24 hours.  If it is bedtime and you are having loose stools, take 2 tablets at bedtime, then 2 tablets every 4 hours until morning.   Always call the [facility] if you are having loose stools/diarrhea that you can't get under control.  Loose stools/diarrhea leads to dehydration (loss of water) in your body.  We have other options of trying to get the loose stools/diarrhea to stop but you must let us know!   Constipation Sheet  Colace - 100 mg capsules - take 2 capsules daily.  If this doesn't help then you can increase to 2 capsules twice daily.  Please call if the above does not work for you. Do not go more than 2 days without a bowel movement.  It is very important that you do not become constipated.  It will make you feel sick to your stomach (nausea) and can cause abdominal pain and vomiting.  Nausea Sheet   Compazine/Prochlorperazine 10mg  tablet. Take 1 tablet every 6 hours as needed for nausea/vomiting (This can make you drowsy).  If you are having persistent nausea (nausea that does not stop) please call the [facility] and let us know the amount of nausea that you are experiencing.  If you begin to vomit, you need to call the [facility] and if it is the weekend and you have vomited more than one time and can't get it to stop-go to the Emergency Room.  Persistent nausea/vomiting can lead to dehydration (loss of fluid in your body) and will make you feel very weak and unwell. Ice chips, sips of clear liquids, foods that  are at room temperature, crackers, and toast tend to be better tolerated.   SYMPTOMS TO REPORT AS SOON AS POSSIBLE AFTER TREATMENT:  FEVER GREATER THAN 100.5 F  CHILLS WITH OR WITHOUT FEVER  NAUSEA AND VOMITING THAT IS NOT CONTROLLED WITH YOUR NAUSEA MEDICATION  UNUSUAL SHORTNESS OF BREATH  UNUSUAL BRUISING OR BLEEDING  TENDERNESS IN MOUTH AND THROAT WITH OR WITHOUT   PRESENCE OF ULCERS  URINARY PROBLEMS  BOWEL PROBLEMS  UNUSUAL RASH      Wear comfortable clothing and clothing appropriate for easy access to any Portacath or PICC line. Let us know if there is anything that we can do to make your therapy better!    What to do if you need assistance after hours or on the weekends: CALL [phone].  HOLD on the line, do not hang up.  You will hear multiple messages but at the end you will be connected with a nurse triage line.  They will contact the doctor if necessary.  Most of the time they will be able to assist you.  Do not call the hospital operator.      I have been informed and understand all of the instructions given to me and have received a copy. I have been instructed to call the clinic [phone] or my family physician as soon as possible for continued medical care, if indicated. I do not have any more  questions at this time but understand that I may call the [facility] or the Patient Navigator at [phone] during office hours should I have questions or need assistance in obtaining follow-up care.

## 2019-06-02 NOTE — Assessment & Plan Note (Signed)
1.  Extensive stage small cell lung cancer: -We reviewed the results of the liver biopsy which was consistent with small cell lung cancer. -We talked about the normal prognosis of extensive stage small cell lung cancer with survival measured in months 2 years.  We talked about treatment with the chemoimmunotherapy versus best supportive care in the form of hospice. -Treatment improves survival as well as symptom control. -Patient opted for active therapy.  We will plan to start treatment as soon as possible.  We talked about starting him on combination chemoimmunotherapy with carboplatin, etoposide and atezolizumab given once every 3 weeks for 4 cycles followed by maintenance atezolizumab.  We will plan to repeat scans after 2-3 cycles. -We discussed the side effects in detail.  We will plan to start chemotherapy with peripheral access.  We will plan to arrange for port down the line. -We also reviewed results of the brain scan which did not show any evidence of metastatic disease. -We have also reviewed MRI of the thoracic spine from 05/26/2019.  There is obliteration of T1-2 neural foramina.  There is a small extradural mass with no cord impingement.  He does not have any weakness in the extremities other than generalized weakness.  We will proceed with chemotherapy as soon as possible.  2.  Diffuse body pains: -I have increased his pain medication to oxycodone 10 mg every 4 hours as needed which is helping.

## 2019-06-02 NOTE — Progress Notes (Signed)
START ON PATHWAY REGIMEN - Small Cell Lung     Cycles 1 through 4, every 21 days:     Atezolizumab      Carboplatin      Etoposide    Cycles 5 and beyond, every 21 days:     Atezolizumab   **Always confirm dose/schedule in your pharmacy ordering system**  Patient Characteristics: Newly Diagnosed, Preoperative or Nonsurgical Candidate (Clinical Staging), First Line, Extensive Stage Therapeutic Status: Newly Diagnosed, Preoperative or Nonsurgical Candidate (Clinical Staging) AJCC T Category: cT4 AJCC N Category: cN2 AJCC M Category: pM1c AJCC 8 Stage Grouping: IVB Stage Classification: Extensive Intent of Therapy: Non-Curative / Palliative Intent, Discussed with Patient 

## 2019-06-02 NOTE — Progress Notes (Signed)
Dustin Shaw, [place] [address]   CLINIC:  Medical Oncology/Hematology  PCP:  Wannetta Sender, FNP [facility] [place] [address] [place] [address] [phone]   REASON FOR VISIT:  Follow-up for extensive stage small cell lung cancer.  CURRENT THERAPY: Carboplatin, etoposide and atezolizumab.  BRIEF ONCOLOGIC HISTORY:  Oncology History  Small cell lung cancer ([place])  06/02/2019 Initial Diagnosis   Small cell lung cancer ([place])   06/03/2019 -  Chemotherapy   The patient had palonosetron (ALOXI) injection 0.25 mg, 0.25 mg, Intravenous,  Once, 0 of 4 cycles pegfilgrastim-jmdb (FULPHILA) injection 6 mg, 6 mg, Subcutaneous,  Once, 0 of 4 cycles CARBOplatin (PARAPLATIN) in sodium chloride 0.9 % 100 mL chemo infusion, , Intravenous,  Once, 0 of 4 cycles etoposide (VEPESID) 170 mg in sodium chloride 0.9 % 500 mL chemo infusion, 100 mg/m2, Intravenous,  Once, 0 of 4 cycles fosaprepitant (EMEND) 150 mg in sodium chloride 0.9 % 145 mL IVPB, 150 mg, Intravenous,  Once, 0 of 4 cycles atezolizumab (TECENTRIQ) 1,200 mg in sodium chloride 0.9 % 250 mL chemo infusion, 1,200 mg, Intravenous, Once, 0 of 8 cycles  for chemotherapy treatment.       CANCER STAGING: Cancer Staging Small cell lung cancer ([place]) Staging form: Lung, AJCC 8th Edition - Clinical stage from 06/02/2019: Stage IVB (cT4, cN2, cM1c) - Unsigned    INTERVAL HISTORY:  Mr. Robbins 86 y.o. male seen for follow-up of liver biopsy and imaging results.  Appetite is 50%.  Energy levels are very low.  Generalized pain is reported as 10 out of 10.  I have increased his pain medication to oxycodone 10 mg every 4 hours as every 6 hours is not helping.  Reports some shortness of breath on exertion.  Also has some constipation associated with it.  Denies any headaches or vision changes.  No numbness or tingling in the extremities reported.  Denies any history of  autoimmune problems.    REVIEW OF SYSTEMS:  Review of Systems  Constitutional: Positive for fatigue.  Respiratory: Positive for shortness of breath.   Gastrointestinal: Positive for constipation.  All other systems reviewed and are negative.    PAST MEDICAL/SURGICAL HISTORY:  Past Medical History:  Diagnosis Date  . Arthritis   . BPH (benign prostatic hyperplasia)   . Diabetes mellitus without complication [facility])    Past Surgical History:  Procedure Laterality Date  . CHOLECYSTECTOMY       SOCIAL HISTORY:  Social History   Socioeconomic History  . Marital status: Married    Spouse name: Not on file  . Number of children: Not on file  . Years of education: Not on file  . Highest education level: Not on file  Occupational History  . Not on file  Tobacco Use  . Smoking status: Former Research scientist (life sciences)  . Smokeless tobacco: Current User    Types: Chew  Substance and Sexual Activity  . Alcohol use: Never  . Drug use: Never  . Sexual activity: Not on file  Other Topics Concern  . Not on file  Social History Narrative  . Not on file   Social Determinants of Health   Financial Resource Strain: Low Risk   . Difficulty of Paying Living Expenses: Not very hard  Food Insecurity: No Food Insecurity  . Worried About Charity fundraiser in the Last Year: Never true  . Ran Out of Food in the Last  Year: Never true  Transportation Needs: No Transportation Needs  . Lack of Transportation (Medical): No  . Lack of Transportation (Non-Medical): No  Physical Activity: Inactive  . Days of Exercise per Week: 0 days  . Minutes of Exercise per Session: 0 min  Stress: No Stress Concern Present  . Feeling of Stress : Not at all  Social Connections: Slightly Isolated  . Frequency of Communication with Friends and Family: More than three times a week  . Frequency of Social Gatherings with Friends and Family: More than three times a week  . Attends Religious Services: More than 4 times per  year  . Active Member of Clubs or Organizations: No  . Attends Archivist Meetings: Never  . Marital Status: Married  Human resources officer Violence: Not At Risk  . Fear of Current or Ex-Partner: No  . Emotionally Abused: No  . Physically Abused: No  . Sexually Abused: No    FAMILY HISTORY:  History reviewed. No pertinent family history.  CURRENT MEDICATIONS:  Outpatient Encounter Medications as of 06/02/2019  Medication Sig  . alum & mag hydroxide-simeth (MAALOX/MYLANTA) 200-200-20 MG/5ML suspension Take 15 mLs by mouth daily as needed for indigestion or heartburn.  . calcium carbonate (TUMS - DOSED IN MG ELEMENTAL CALCIUM) 500 MG chewable tablet Chew 2 tablets by mouth 2 (two) times daily as needed for indigestion or heartburn.  . celecoxib (CELEBREX) 200 MG capsule Take 200 mg by mouth daily.  . cetirizine (ZYRTEC) 10 MG tablet Take 10 mg by mouth daily.  . cholecalciferol (VITAMIN D3) 25 MCG (1000 UNIT) tablet Take 1,000 Units by mouth daily.  . digoxin (LANOXIN) 0.125 MG tablet Take 125 mcg by mouth daily.  . metFORMIN (GLUCOPHAGE) 500 MG tablet Take 500 mg by mouth 2 (two) times daily.  . Multiple Vitamin (MULTIVITAMIN WITH MINERALS) TABS tablet Take 1 tablet by mouth daily.  . Oxycodone HCl 10 MG TABS Take 1 tablet (10 mg total) by mouth every 4 (four) hours as needed (for pain).  . Polyvinyl Alcohol-Povidone (REFRESH OP) Place 1 drop into both eyes daily as needed (dry eyes).  . tamsulosin (FLOMAX) 0.4 MG CAPS capsule Take 0.4 mg by mouth every evening.   . traZODone (DESYREL) 50 MG tablet Take 50 mg by mouth in the morning and at bedtime. At dinner and bedtime   No facility-administered encounter medications on file as of 06/02/2019.    ALLERGIES:  Allergies  Allergen Reactions  . Amoxicillin Hives and Swelling    Did it involve swelling of the face/tongue/throat, SOB, or low BP? Yes Did it involve sudden or severe rash/hives, skin peeling, or any reaction on the  inside of your mouth or nose? Yes Did you need to seek medical attention at a hospital or doctor's office? Yes When did it last happen?20 + years If all above answers are "NO", may proceed with cephalosporin use.   . Codeine Swelling     PHYSICAL EXAM:  ECOG Performance status: 2  Vitals:   06/02/19 1000  BP: 112/63  Pulse: 92  Resp: 18  Temp: (!) 97.5 F (36.4 C)  SpO2: 92%   Filed Weights   06/02/19 1000  Weight: 125 lb 8 oz (56.9 kg)    Physical Exam Vitals reviewed.  Constitutional:      Appearance: Normal appearance.  Cardiovascular:     Rate and Rhythm: Normal rate and regular rhythm.     Heart sounds: Normal heart sounds.  Pulmonary:     Effort:  Pulmonary effort is normal.     Breath sounds: Normal breath sounds.  Abdominal:     General: There is no distension.     Palpations: Abdomen is soft. There is no mass.  Skin:    General: Skin is warm.  Neurological:     General: No focal deficit present.     Mental Status: He is alert and oriented to person, place, and time.  Psychiatric:        Mood and Affect: Mood normal.        Behavior: Behavior normal.      LABORATORY DATA:  I have reviewed the labs as listed.  CBC    Component Value Date/Time   WBC 8.4 05/27/2019 1113   RBC 4.94 05/27/2019 1113   HGB 13.9 05/27/2019 1113   HCT 44.4 05/27/2019 1113   PLT 441 (H) 05/27/2019 1113   MCV 89.9 05/27/2019 1113   MCH 28.1 05/27/2019 1113   MCHC 31.3 05/27/2019 1113   RDW 14.6 05/27/2019 1113   LYMPHSABS 0.8 05/27/2019 1113   MONOABS 0.4 05/27/2019 1113   EOSABS 0.0 05/27/2019 1113   BASOSABS 0.1 05/27/2019 1113   CMP Latest Ref Rng & Units 05/27/2019 05/10/2019  Glucose 70 - 99 mg/dL 131(H) 130(H)  BUN 8 - 23 mg/dL 10 8  Creatinine 0.61 - 1.24 mg/dL 0.62 0.49(L)  Sodium 135 - 145 mmol/L 139 136  Potassium 3.5 - 5.1 mmol/L 3.6 3.3(L)  Chloride 98 - 111 mmol/L 100 100  CO2 22 - 32 mmol/L 26 26  Calcium 8.9 - 10.3 mg/dL 9.5 9.2  Total  Protein 6.5 - 8.1 g/dL 6.3(L) 7.3  Total Bilirubin 0.3 - 1.2 mg/dL 0.7 0.7  Alkaline Phos 38 - 126 U/L 90 92  AST 15 - 41 U/L 24 17  ALT 0 - 44 U/L 15 12       DIAGNOSTIC IMAGING:  I have independently reviewed the scans and discussed with the patient.     ASSESSMENT & PLAN:   Small cell lung cancer ([place]) 1.  Extensive stage small cell lung cancer: -We reviewed the results of the liver biopsy which was consistent with small cell lung cancer. -We talked about the normal prognosis of extensive stage small cell lung cancer with survival measured in months 2 years.  We talked about treatment with the chemoimmunotherapy versus best supportive care in the form of hospice. -Treatment improves survival as well as symptom control. -Patient opted for active therapy.  We will plan to start treatment as soon as possible.  We talked about starting him on combination chemoimmunotherapy with carboplatin, etoposide and atezolizumab given once every 3 weeks for 4 cycles followed by maintenance atezolizumab.  We will plan to repeat scans after 2-3 cycles. -We discussed the side effects in detail.  We will plan to start chemotherapy with peripheral access.  We will plan to arrange for port down the line. -We also reviewed results of the brain scan which did not show any evidence of metastatic disease. -We have also reviewed MRI of the thoracic spine from 05/26/2019.  There is obliteration of T1-2 neural foramina.  There is a small extradural mass with no cord impingement.  He does not have any weakness in the extremities other than generalized weakness.  We will proceed with chemotherapy as soon as possible.  2.  Diffuse body pains: -I have increased his pain medication to oxycodone 10 mg every 4 hours as needed which is helping.      Orders  placed this encounter:  Orders Placed This Encounter  Procedures  . CBC with Differential/Platelet  . Comprehensive metabolic panel  . Magnesium   Total time  spent is 40 minutes with more than 50% of the time spent face-to-face discussing new diagnosis, prognosis, treatment plan, counseling and coordination of care.   Derek Jack, MD [place] [phone]

## 2019-06-03 ENCOUNTER — Inpatient Hospital Stay (HOSPITAL_COMMUNITY): Payer: Medicare HMO

## 2019-06-03 ENCOUNTER — Ambulatory Visit (HOSPITAL_COMMUNITY): Payer: Medicare HMO | Admitting: Hematology

## 2019-06-03 ENCOUNTER — Encounter (HOSPITAL_COMMUNITY): Payer: Self-pay

## 2019-06-03 VITALS — BP 95/48 | HR 86 | Temp 97.9°F | Resp 18

## 2019-06-03 DIAGNOSIS — C3490 Malignant neoplasm of unspecified part of unspecified bronchus or lung: Secondary | ICD-10-CM

## 2019-06-03 DIAGNOSIS — C3410 Malignant neoplasm of upper lobe, unspecified bronchus or lung: Secondary | ICD-10-CM | POA: Diagnosis not present

## 2019-06-03 DIAGNOSIS — J9601 Acute respiratory failure with hypoxia: Secondary | ICD-10-CM | POA: Diagnosis not present

## 2019-06-03 DIAGNOSIS — C3491 Malignant neoplasm of unspecified part of right bronchus or lung: Secondary | ICD-10-CM

## 2019-06-03 LAB — CBC WITH DIFFERENTIAL/PLATELET
Abs Immature Granulocytes: 0.06 10*3/uL (ref 0.00–0.07)
Basophils Absolute: 0.1 10*3/uL (ref 0.0–0.1)
Basophils Relative: 1 %
Eosinophils Absolute: 0.1 10*3/uL (ref 0.0–0.5)
Eosinophils Relative: 1 %
HCT: 45.6 % (ref 39.0–52.0)
Hemoglobin: 14.5 g/dL (ref 13.0–17.0)
Immature Granulocytes: 1 %
Lymphocytes Relative: 14 %
Lymphs Abs: 1.3 10*3/uL (ref 0.7–4.0)
MCH: 28.8 pg (ref 26.0–34.0)
MCHC: 31.8 g/dL (ref 30.0–36.0)
MCV: 90.5 fL (ref 80.0–100.0)
Monocytes Absolute: 0.6 10*3/uL (ref 0.1–1.0)
Monocytes Relative: 6 %
Neutro Abs: 7.4 10*3/uL (ref 1.7–7.7)
Neutrophils Relative %: 77 %
Platelets: 433 10*3/uL — ABNORMAL HIGH (ref 150–400)
RBC: 5.04 MIL/uL (ref 4.22–5.81)
RDW: 14.9 % (ref 11.5–15.5)
WBC: 9.5 10*3/uL (ref 4.0–10.5)
nRBC: 0 % (ref 0.0–0.2)

## 2019-06-03 LAB — COMPREHENSIVE METABOLIC PANEL
ALT: 12 U/L (ref 0–44)
AST: 23 U/L (ref 15–41)
Albumin: 3.1 g/dL — ABNORMAL LOW (ref 3.5–5.0)
Alkaline Phosphatase: 105 U/L (ref 38–126)
Anion gap: 11 (ref 5–15)
BUN: 11 mg/dL (ref 8–23)
CO2: 29 mmol/L (ref 22–32)
Calcium: 9.6 mg/dL (ref 8.9–10.3)
Chloride: 95 mmol/L — ABNORMAL LOW (ref 98–111)
Creatinine, Ser: 0.59 mg/dL — ABNORMAL LOW (ref 0.61–1.24)
GFR calc Af Amer: >60 mL/min (ref >60)
GFR calc non Af Amer: >60 mL/min (ref >60)
Glucose, Bld: 150 mg/dL — ABNORMAL HIGH (ref 70–99)
Potassium: 3.4 mmol/L — ABNORMAL LOW (ref 3.5–5.1)
Sodium: 135 mmol/L (ref 135–145)
Total Bilirubin: 0.6 mg/dL (ref 0.3–1.2)
Total Protein: 7.3 g/dL (ref 6.5–8.1)

## 2019-06-03 LAB — MAGNESIUM: Magnesium: 1.7 mg/dL (ref 1.7–2.4)

## 2019-06-03 LAB — URIC ACID: Uric Acid, Serum: 3.9 mg/dL (ref 3.7–8.6)

## 2019-06-03 MED ORDER — SODIUM CHLORIDE 0.9 % IV SOLN
1200.0000 mg | Freq: Once | INTRAVENOUS | Status: AC
  Administered 2019-06-03: 1200 mg via INTRAVENOUS
  Filled 2019-06-03: qty 20

## 2019-06-03 MED ORDER — SODIUM CHLORIDE 0.9 % IV SOLN: Freq: Once | INTRAVENOUS | Status: AC

## 2019-06-03 MED ORDER — SODIUM CHLORIDE 0.9 % IV SOLN
150.0000 mg | Freq: Once | INTRAVENOUS | Status: AC
  Administered 2019-06-03: 150 mg via INTRAVENOUS
  Filled 2019-06-03: qty 150

## 2019-06-03 MED ORDER — LIDOCAINE-PRILOCAINE 2.5-2.5 % EX CREA: TOPICAL_CREAM | CUTANEOUS | 0 refills | Status: AC

## 2019-06-03 MED ORDER — POTASSIUM CHLORIDE CRYS ER 20 MEQ PO TBCR
20.0000 meq | EXTENDED_RELEASE_TABLET | Freq: Once | ORAL | Status: AC
  Administered 2019-06-03: 20 meq via ORAL
  Filled 2019-06-03: qty 1

## 2019-06-03 MED ORDER — SODIUM CHLORIDE 0.9 % IV SOLN
10.0000 mg | Freq: Once | INTRAVENOUS | Status: AC
  Administered 2019-06-03: 10 mg via INTRAVENOUS
  Filled 2019-06-03: qty 10

## 2019-06-03 MED ORDER — PALONOSETRON HCL INJECTION 0.25 MG/5ML
0.2500 mg | Freq: Once | INTRAVENOUS | Status: AC
  Administered 2019-06-03: 0.25 mg via INTRAVENOUS
  Filled 2019-06-03: qty 5

## 2019-06-03 MED ORDER — SODIUM CHLORIDE 0.9 % IV SOLN
80.0000 mg/m2 | Freq: Once | INTRAVENOUS | Status: AC
  Administered 2019-06-03: 140 mg via INTRAVENOUS
  Filled 2019-06-03: qty 7

## 2019-06-03 MED ORDER — SODIUM CHLORIDE 0.9 % IV SOLN
346.5000 mg | Freq: Once | INTRAVENOUS | Status: AC
  Administered 2019-06-03: 350 mg via INTRAVENOUS
  Filled 2019-06-03: qty 35

## 2019-06-03 MED ORDER — SODIUM CHLORIDE 0.9% FLUSH
10.0000 mL | INTRAVENOUS | Status: DC | PRN
  Administered 2019-06-03: 10 mL

## 2019-06-03 NOTE — Progress Notes (Signed)
Patient tolerated chemotherapy with no complaints voiced. Peripheral IV site intact with good blood return noted with discharge.  No bruising or swelling noted at site.  Dressing intact.  VSs with discharge and left by wheelchair with family.  No s/s of distress noted.

## 2019-06-03 NOTE — Progress Notes (Signed)
Chemotherapy/immunotherapy education packet given and discussed with pt and family in detail.  Discussed diagnosis and staging, tx regimen, and intent of tx.  Reviewed chemotherapy/immunotherapy medications and side effects, as well as pre-medications.  Instructed on how to manage side effects at home, and when to call the clinic.  Importance of fever/chills discussed with pt and family. Discussed precautions to implement at home after receiving tx, as well as self care strategies. Phone numbers provided for clinic during regular working hours, also how to reach the clinic after hours and on weekends. Pt and family provided the opportunity to ask questions - all questions answered to pt's and family satisfaction.

## 2019-06-04 ENCOUNTER — Other Ambulatory Visit: Payer: Self-pay

## 2019-06-04 ENCOUNTER — Encounter (HOSPITAL_COMMUNITY): Payer: Self-pay

## 2019-06-04 ENCOUNTER — Inpatient Hospital Stay (HOSPITAL_COMMUNITY): Payer: Medicare HMO

## 2019-06-04 VITALS — BP 94/48 | HR 84 | Temp 98.2°F | Resp 18

## 2019-06-04 DIAGNOSIS — C3490 Malignant neoplasm of unspecified part of unspecified bronchus or lung: Secondary | ICD-10-CM

## 2019-06-04 MED ORDER — SODIUM CHLORIDE 0.9 % IV SOLN
10.0000 mg | Freq: Once | INTRAVENOUS | Status: AC
  Administered 2019-06-04: 10 mg via INTRAVENOUS
  Filled 2019-06-04: qty 10

## 2019-06-04 MED ORDER — SODIUM CHLORIDE 0.9 % IV SOLN: 100.0000 mg/m2 | Freq: Once | INTRAVENOUS | Status: DC

## 2019-06-04 MED ORDER — SODIUM CHLORIDE 0.9 % IV SOLN: Freq: Once | INTRAVENOUS | Status: AC

## 2019-06-04 MED ORDER — SODIUM CHLORIDE 0.9 % IV SOLN
80.0000 mg/m2 | Freq: Once | INTRAVENOUS | Status: AC
  Administered 2019-06-04: 140 mg via INTRAVENOUS
  Filled 2019-06-04: qty 7

## 2019-06-04 NOTE — Progress Notes (Signed)
Dustin Shaw tolerated VP-16 infusion well without complaints or incident. Peripheral IV site checked by 2 RN'S prior to and after infusion.IV site saline locked for use tomorrow VSS upon discharge. Pt discharged via wheelchair in satisfactory condition accompanied by his wife

## 2019-06-04 NOTE — Patient Instructions (Signed)
[facility] Discharge Instructions for Patients Receiving Chemotherapy   Beginning January 23rd 2017 lab work for the [facility] will be done in the  Main lab at [facility] on 1st floor. If you have a lab appointment with the [facility] please come in thru the  Main Entrance and check in at the main information desk   Today you received the following chemotherapy agents VP-16. Follow-up as scheduled. Call clinic for any questions or concerns  To help prevent nausea and vomiting after your treatment, we encourage you to take your nausea medication   If you develop nausea and vomiting, or diarrhea that is not controlled by your medication, call the clinic.  The clinic phone number is [phone]. Office hours are Monday-Friday 8:30am-5:00pm.  BELOW ARE SYMPTOMS THAT SHOULD BE REPORTED IMMEDIATELY:  *FEVER GREATER THAN 101.0 F  *CHILLS WITH OR WITHOUT FEVER  NAUSEA AND VOMITING THAT IS NOT CONTROLLED WITH YOUR NAUSEA MEDICATION  *UNUSUAL SHORTNESS OF BREATH  *UNUSUAL BRUISING OR BLEEDING  TENDERNESS IN MOUTH AND THROAT WITH OR WITHOUT PRESENCE OF ULCERS  *URINARY PROBLEMS  *BOWEL PROBLEMS  UNUSUAL RASH Items with * indicate a potential emergency and should be followed up as soon as possible. If you have an emergency after office hours please contact your primary care physician or go to the nearest emergency department.  Please call the clinic during office hours if you have any questions or concerns.   You may also contact the Patient Navigator at [phone] should you have any questions or need assistance in obtaining follow up care.      Resources For Cancer Patients and their Caregivers ? American Cancer Society: Can assist with transportation, wigs, general needs, runs Look Good Feel Better.        (980)380-6400 ? Cancer Care: Provides financial assistance, online support groups, medication/co-pay assistance.  1-800-813-HOPE  231-690-2460) ? [facility] Assists [place] cancer patients and their families through emotional , educational and financial support.  (215)279-7330 ? [place] DSS Where to apply for food stamps, Medicaid and utility assistance. 708-192-7484 ? [facility]: Transportation to medical appointments. 320-475-8639 ? Social Security Administration: May apply for disability if have a Stage IV cancer. ([phone] [phone] ? [employer], Disability and Transit Services: Assists with nutrition, care and transit needs. 901-387-3875

## 2019-06-05 ENCOUNTER — Inpatient Hospital Stay (HOSPITAL_COMMUNITY): Payer: Medicare HMO

## 2019-06-05 VITALS — BP 108/63 | HR 77 | Temp 97.5°F | Resp 16

## 2019-06-05 DIAGNOSIS — C3490 Malignant neoplasm of unspecified part of unspecified bronchus or lung: Secondary | ICD-10-CM

## 2019-06-05 MED ORDER — PROPOFOL 10 MG/ML IV BOLUS
INTRAVENOUS | Status: AC
  Filled 2019-06-05: qty 20

## 2019-06-05 MED ORDER — SODIUM CHLORIDE 0.9 % IV SOLN
10.0000 mg | Freq: Once | INTRAVENOUS | Status: AC
  Administered 2019-06-05: 10 mg via INTRAVENOUS
  Filled 2019-06-05: qty 10

## 2019-06-05 MED ORDER — SODIUM CHLORIDE 0.9 % IV SOLN: Freq: Once | INTRAVENOUS | Status: AC

## 2019-06-05 MED ORDER — SODIUM CHLORIDE 0.9 % IV SOLN
80.0000 mg/m2 | Freq: Once | INTRAVENOUS | Status: AC
  Administered 2019-06-05: 140 mg via INTRAVENOUS
  Filled 2019-06-05: qty 7

## 2019-06-05 NOTE — Progress Notes (Signed)
Treatment given per orders. Patient tolerated it well without problems. Vitals stable and discharged home from clinic via wheelchair Follow up as scheduled.  

## 2019-06-05 NOTE — Patient Instructions (Signed)
[facility] Discharge Instructions for Patients Receiving Chemotherapy  Today you received the following chemotherapy agents   To help prevent nausea and vomiting after your treatment, we encourage you to take your nausea medication   If you develop nausea and vomiting that is not controlled by your nausea medication, call the clinic.   BELOW ARE SYMPTOMS THAT SHOULD BE REPORTED IMMEDIATELY:  *FEVER GREATER THAN 100.5 F  *CHILLS WITH OR WITHOUT FEVER  NAUSEA AND VOMITING THAT IS NOT CONTROLLED WITH YOUR NAUSEA MEDICATION  *UNUSUAL SHORTNESS OF BREATH  *UNUSUAL BRUISING OR BLEEDING  TENDERNESS IN MOUTH AND THROAT WITH OR WITHOUT PRESENCE OF ULCERS  *URINARY PROBLEMS  *BOWEL PROBLEMS  UNUSUAL RASH Items with * indicate a potential emergency and should be followed up as soon as possible.  Feel free to call the clinic should you have any questions or concerns. The clinic phone number is [phone].  Please show the CHEMO ALERT CARD at check-in to the Emergency Department and triage nurse.   

## 2019-06-06 ENCOUNTER — Encounter (HOSPITAL_COMMUNITY): Payer: Self-pay | Admitting: Emergency Medicine

## 2019-06-06 ENCOUNTER — Inpatient Hospital Stay (HOSPITAL_COMMUNITY)
Admission: EM | Admit: 2019-06-06 | Discharge: 2019-06-09 | DRG: 180 | Disposition: A | Discharge status: Home Health | Payer: Medicare HMO | Attending: Internal Medicine | Admitting: Internal Medicine

## 2019-06-06 ENCOUNTER — Other Ambulatory Visit: Payer: Self-pay

## 2019-06-06 ENCOUNTER — Emergency Department (HOSPITAL_COMMUNITY): Payer: Medicare HMO

## 2019-06-06 DIAGNOSIS — C3410 Malignant neoplasm of upper lobe, unspecified bronchus or lung: Principal | ICD-10-CM | POA: Diagnosis present

## 2019-06-06 DIAGNOSIS — Z515 Encounter for palliative care: Secondary | ICD-10-CM

## 2019-06-06 DIAGNOSIS — C787 Secondary malignant neoplasm of liver and intrahepatic bile duct: Secondary | ICD-10-CM | POA: Diagnosis present

## 2019-06-06 DIAGNOSIS — C7989 Secondary malignant neoplasm of other specified sites: Secondary | ICD-10-CM | POA: Diagnosis present

## 2019-06-06 DIAGNOSIS — G893 Neoplasm related pain (acute) (chronic): Secondary | ICD-10-CM | POA: Diagnosis present

## 2019-06-06 DIAGNOSIS — J189 Pneumonia, unspecified organism: Secondary | ICD-10-CM | POA: Diagnosis present

## 2019-06-06 DIAGNOSIS — Z720 Tobacco use: Secondary | ICD-10-CM

## 2019-06-06 DIAGNOSIS — C7951 Secondary malignant neoplasm of bone: Secondary | ICD-10-CM | POA: Diagnosis present

## 2019-06-06 DIAGNOSIS — C3491 Malignant neoplasm of unspecified part of right bronchus or lung: Secondary | ICD-10-CM | POA: Diagnosis present

## 2019-06-06 DIAGNOSIS — N400 Enlarged prostate without lower urinary tract symptoms: Secondary | ICD-10-CM | POA: Diagnosis present

## 2019-06-06 DIAGNOSIS — I351 Nonrheumatic aortic (valve) insufficiency: Secondary | ICD-10-CM | POA: Diagnosis not present

## 2019-06-06 DIAGNOSIS — Z7983 Long term (current) use of bisphosphonates: Secondary | ICD-10-CM | POA: Diagnosis not present

## 2019-06-06 DIAGNOSIS — Z7189 Other specified counseling: Secondary | ICD-10-CM

## 2019-06-06 DIAGNOSIS — Z85118 Personal history of other malignant neoplasm of bronchus and lung: Secondary | ICD-10-CM

## 2019-06-06 DIAGNOSIS — E43 Unspecified severe protein-calorie malnutrition: Secondary | ICD-10-CM | POA: Diagnosis present

## 2019-06-06 DIAGNOSIS — Z20822 Contact with and (suspected) exposure to covid-19: Secondary | ICD-10-CM | POA: Diagnosis present

## 2019-06-06 DIAGNOSIS — E876 Hypokalemia: Secondary | ICD-10-CM | POA: Diagnosis not present

## 2019-06-06 DIAGNOSIS — Z7984 Long term (current) use of oral hypoglycemic drugs: Secondary | ICD-10-CM

## 2019-06-06 DIAGNOSIS — Z79899 Other long term (current) drug therapy: Secondary | ICD-10-CM | POA: Diagnosis not present

## 2019-06-06 DIAGNOSIS — Z880 Allergy status to penicillin: Secondary | ICD-10-CM

## 2019-06-06 DIAGNOSIS — J440 Chronic obstructive pulmonary disease with acute lower respiratory infection: Secondary | ICD-10-CM | POA: Diagnosis present

## 2019-06-06 DIAGNOSIS — R0902 Hypoxemia: Secondary | ICD-10-CM

## 2019-06-06 DIAGNOSIS — C3490 Malignant neoplasm of unspecified part of unspecified bronchus or lung: Secondary | ICD-10-CM | POA: Diagnosis present

## 2019-06-06 DIAGNOSIS — E119 Type 2 diabetes mellitus without complications: Secondary | ICD-10-CM | POA: Diagnosis present

## 2019-06-06 DIAGNOSIS — I700 Atherosclerosis of aorta: Secondary | ICD-10-CM | POA: Diagnosis present

## 2019-06-06 DIAGNOSIS — Z66 Do not resuscitate: Secondary | ICD-10-CM | POA: Diagnosis present

## 2019-06-06 DIAGNOSIS — I4891 Unspecified atrial fibrillation: Secondary | ICD-10-CM

## 2019-06-06 DIAGNOSIS — Z885 Allergy status to narcotic agent status: Secondary | ICD-10-CM

## 2019-06-06 DIAGNOSIS — Z6821 Body mass index (BMI) 21.0-21.9, adult: Secondary | ICD-10-CM

## 2019-06-06 DIAGNOSIS — J9601 Acute respiratory failure with hypoxia: Secondary | ICD-10-CM | POA: Diagnosis present

## 2019-06-06 DIAGNOSIS — J441 Chronic obstructive pulmonary disease with (acute) exacerbation: Secondary | ICD-10-CM

## 2019-06-06 DIAGNOSIS — C771 Secondary and unspecified malignant neoplasm of intrathoracic lymph nodes: Secondary | ICD-10-CM | POA: Diagnosis present

## 2019-06-06 DIAGNOSIS — R0600 Dyspnea, unspecified: Secondary | ICD-10-CM

## 2019-06-06 HISTORY — DX: Malignant (primary) neoplasm, unspecified: C80.1

## 2019-06-06 LAB — CBC WITH DIFFERENTIAL/PLATELET
Abs Immature Granulocytes: 0.04 10*3/uL (ref 0.00–0.07)
Basophils Absolute: 0 10*3/uL (ref 0.0–0.1)
Basophils Relative: 0 %
Eosinophils Absolute: 0 10*3/uL (ref 0.0–0.5)
Eosinophils Relative: 0 %
HCT: 45.2 % (ref 39.0–52.0)
Hemoglobin: 14 g/dL (ref 13.0–17.0)
Immature Granulocytes: 0 %
Lymphocytes Relative: 10 %
Lymphs Abs: 1.1 10*3/uL (ref 0.7–4.0)
MCH: 28.5 pg (ref 26.0–34.0)
MCHC: 31 g/dL (ref 30.0–36.0)
MCV: 92.1 fL (ref 80.0–100.0)
Monocytes Absolute: 0.2 10*3/uL (ref 0.1–1.0)
Monocytes Relative: 1 %
Neutro Abs: 9.6 10*3/uL — ABNORMAL HIGH (ref 1.7–7.7)
Neutrophils Relative %: 89 %
Platelets: 373 10*3/uL (ref 150–400)
RBC: 4.91 MIL/uL (ref 4.22–5.81)
RDW: 15.1 % (ref 11.5–15.5)
WBC: 11 10*3/uL — ABNORMAL HIGH (ref 4.0–10.5)
nRBC: 0 % (ref 0.0–0.2)

## 2019-06-06 LAB — BASIC METABOLIC PANEL
Anion gap: 9 (ref 5–15)
BUN: 16 mg/dL (ref 8–23)
CO2: 29 mmol/L (ref 22–32)
Calcium: 9 mg/dL (ref 8.9–10.3)
Chloride: 96 mmol/L — ABNORMAL LOW (ref 98–111)
Creatinine, Ser: 0.52 mg/dL — ABNORMAL LOW (ref 0.61–1.24)
GFR calc Af Amer: >60 mL/min (ref >60)
GFR calc non Af Amer: >60 mL/min (ref >60)
Glucose, Bld: 114 mg/dL — ABNORMAL HIGH (ref 70–99)
Potassium: 3.1 mmol/L — ABNORMAL LOW (ref 3.5–5.1)
Sodium: 134 mmol/L — ABNORMAL LOW (ref 135–145)

## 2019-06-06 LAB — DIGOXIN LEVEL: Digoxin Level: 0.9 ng/mL (ref 0.8–2.0)

## 2019-06-06 LAB — TROPONIN I (HIGH SENSITIVITY)
Troponin I (High Sensitivity): 9 ng/L (ref <18)
Troponin I (High Sensitivity): 8 ng/L (ref <18)

## 2019-06-06 LAB — HEMOGLOBIN A1C
Hgb A1c MFr Bld: 6 % — ABNORMAL HIGH (ref 4.8–5.6)
Mean Plasma Glucose: 125.5 mg/dL

## 2019-06-06 LAB — GLUCOSE, CAPILLARY
Glucose-Capillary: 138 mg/dL — ABNORMAL HIGH (ref 70–99)
Glucose-Capillary: 187 mg/dL — ABNORMAL HIGH (ref 70–99)

## 2019-06-06 LAB — BRAIN NATRIURETIC PEPTIDE: B Natriuretic Peptide: 269 pg/mL — ABNORMAL HIGH (ref 0.0–100.0)

## 2019-06-06 LAB — PROCALCITONIN: Procalcitonin: 43.37 ng/mL

## 2019-06-06 IMAGING — CT CT ANGIO CHEST
2 of 6 series · 17 of 46 positions shown · IV contrast (Omnipaque or Isovue)
Comparison: [DATE] chest CT angiogram.

CLINICAL DATA: Dyspnea for 2 days. Recent diagnosis of small cell
lung cancer, initiated chemotherapy this week.

EXAM:
CT ANGIOGRAPHY CHEST WITH CONTRAST
TECHNIQUE: Multidetector CT imaging of the chest was performed using the
standard protocol during bolus administration of intravenous
contrast. Multiplanar CT image reconstructions and MIPs were
obtained to evaluate the vascular anatomy.
CONTRAST:  100mL OMNIPAQUE IOHEXOL 350 MG/ML SOLN

[Series 9: pe axial thins · axial · 0.64mm/px · z∈[+1143,+1439]mm · 14 of 326 slices shown]
[im 15/326  lung]
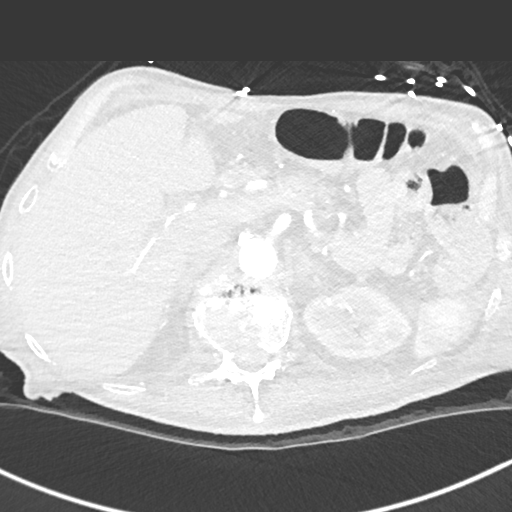
[im 43/326  soft-tissue]
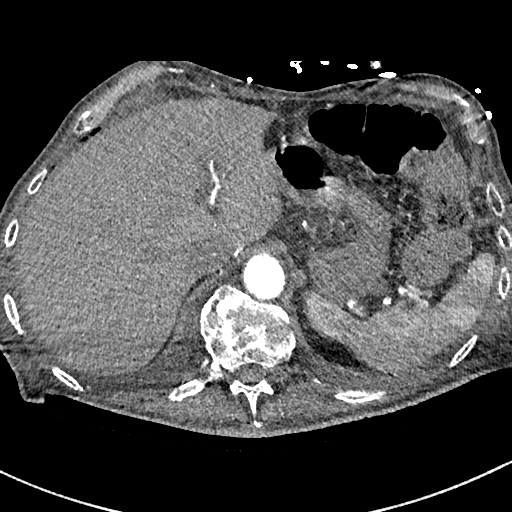
[im 57/326  lung]
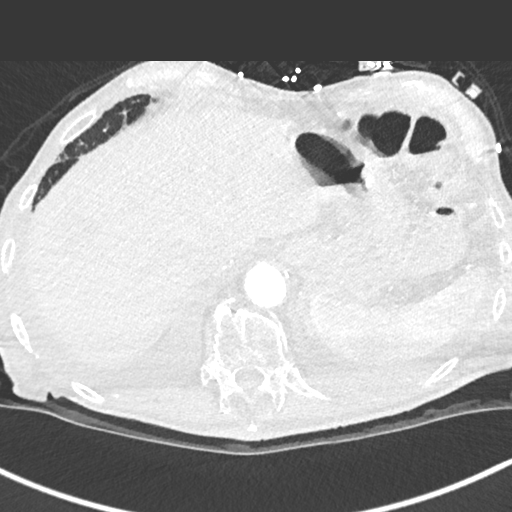
[im 85/326  soft-tissue]
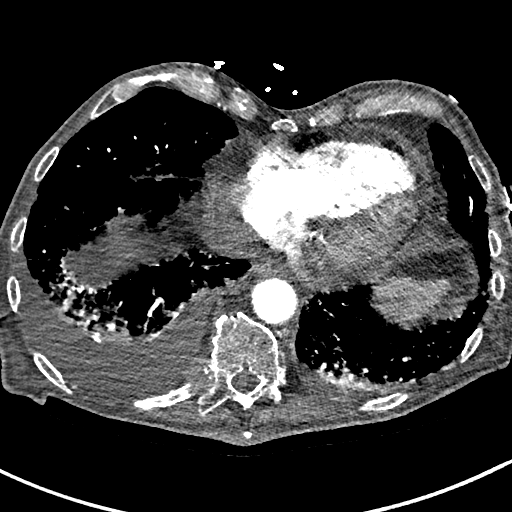
[im 114/326  lung]
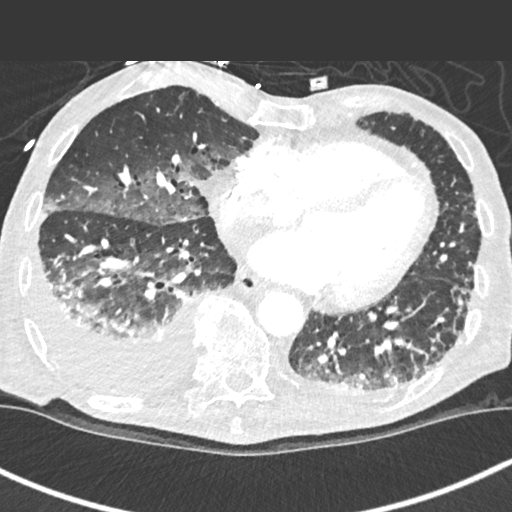
[im 128/326  soft-tissue]
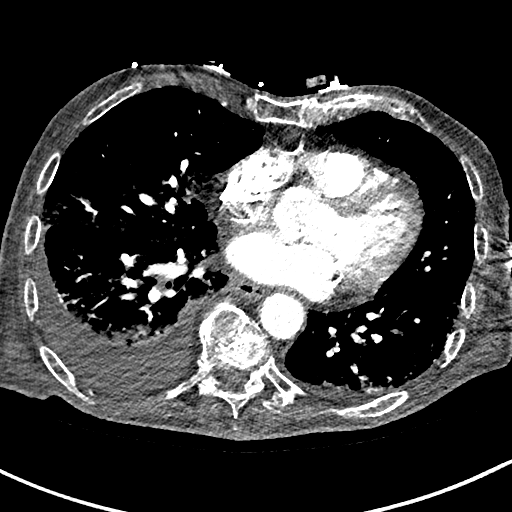
[im 156/326  lung]
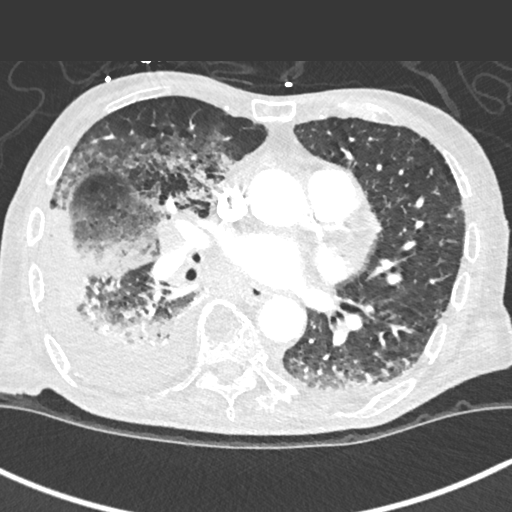
[im 170/326  soft-tissue]
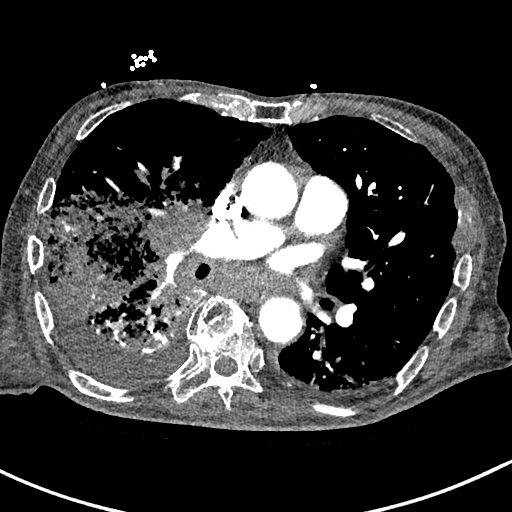
[im 198/326  lung]
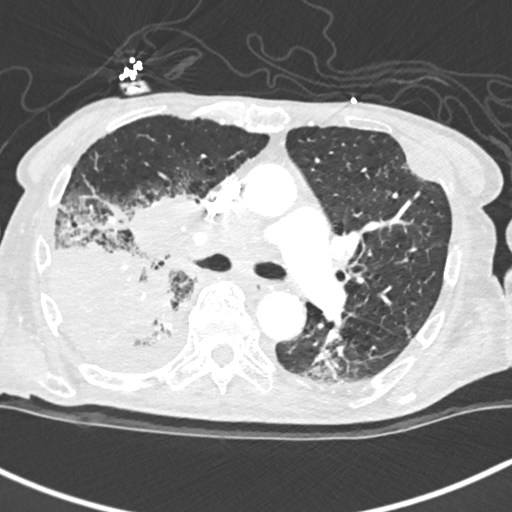
[im 212/326  soft-tissue]
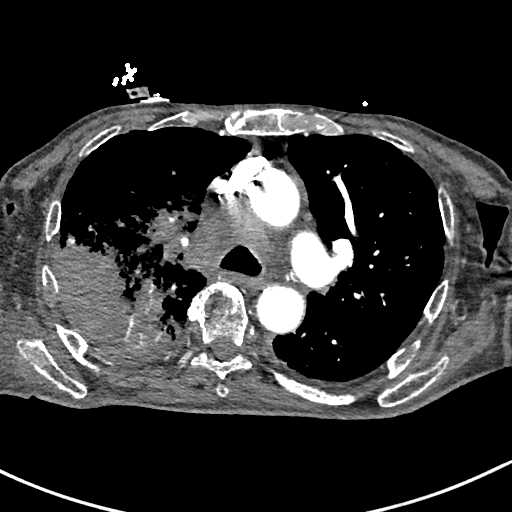
[im 241/326  lung]
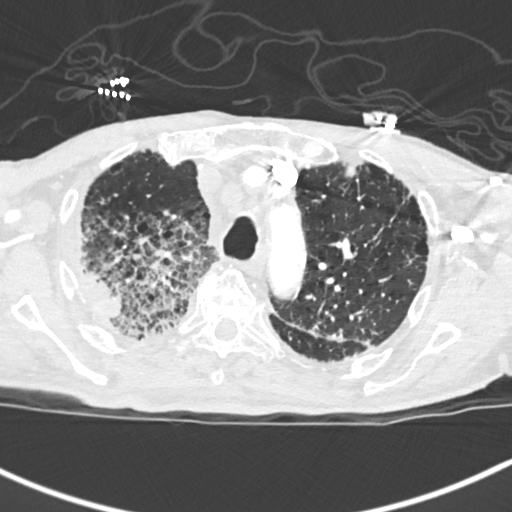
[im 269/326  soft-tissue]
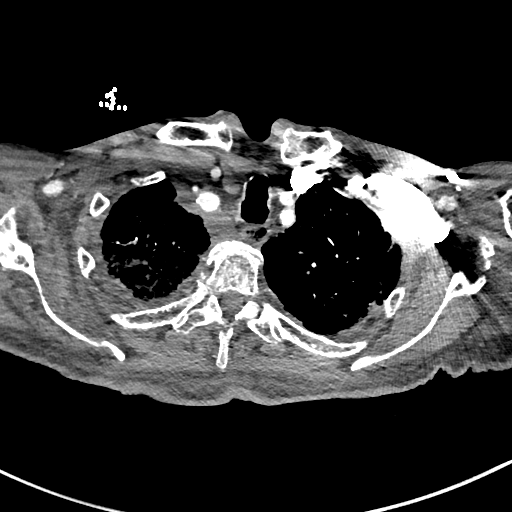
[im 283/326  lung]
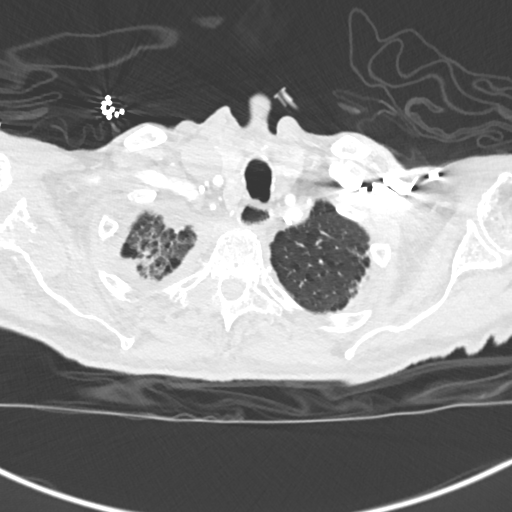
[im 311/326  soft-tissue]
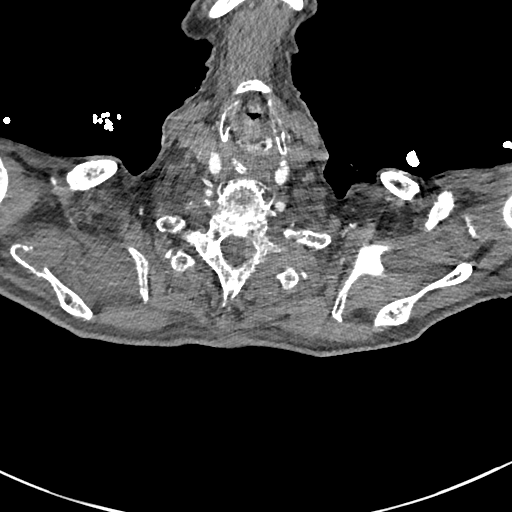

[Series 11: cor soft · coronal · 0.70mm/px · 3 of 142 slices shown]
[im 36/142  soft-tissue]
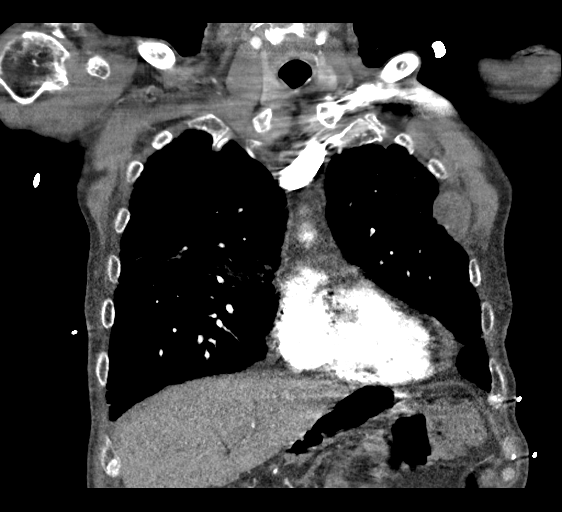
[im 71/142  soft-tissue]
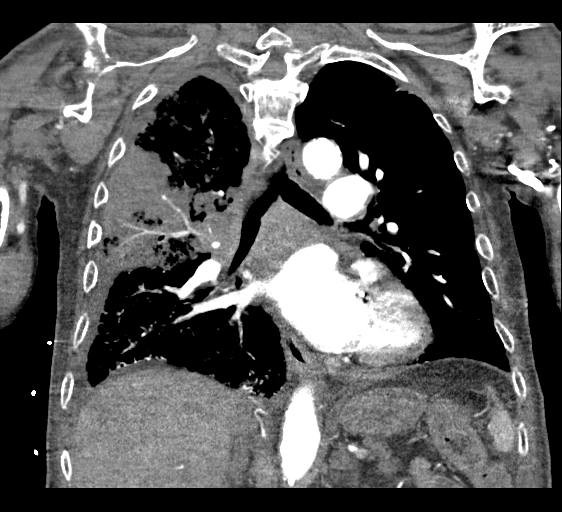
[im 106/142  soft-tissue]
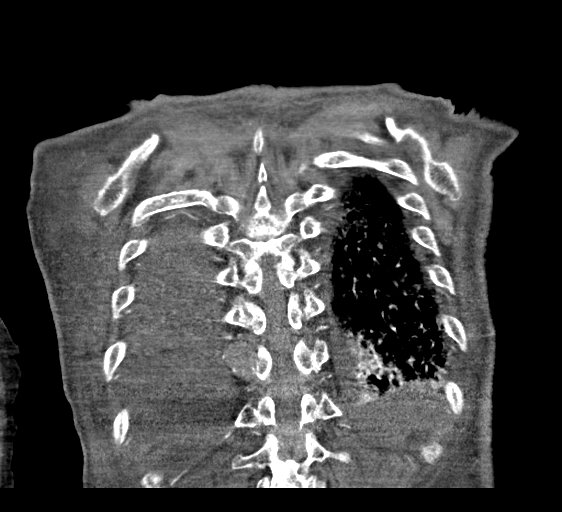

[17 of 46 positions shown; findings below may reference images not displayed]

FINDINGS: Cardiovascular: The study is high quality for the evaluation of
pulmonary embolism. There are no filling defects in the central,
lobar, segmental or subsegmental pulmonary artery branches to
suggest acute pulmonary embolism. Atherosclerotic nonaneurysmal
thoracic aorta. Normal caliber main pulmonary artery. Top-normal
heart size. Trace pericardial effusion/thickening is unchanged. Left
anterior descending coronary atherosclerosis.

Mediastinum/Nodes: Heterogeneous thyroid gland without discrete
thyroid nodules. Unremarkable esophagus. No axillary adenopathy.
Right paratracheal adenopathy up to 2.9 cm short axis diameter
(series 8/image 30), stable using similar measurement technique.
Enlarged 2.0 cm subcarinal node (series 8/image 52), stable. No left
hilar adenopathy.

Lungs/Pleura: No pneumothorax. Small dependent pleural effusions
bilaterally, right greater than left, increased. Moderate
centrilobular and paraseptal emphysema. Poorly marginated right
perihilar 7.6 x 4.9 cm lung mass (series 9/image 150), increased
from 6.3 x 3.9 cm. Stable ill-defined masslike consolidation
throughout the dependent peripheral right upper lobe measuring up to
8.7 cm (series 10/image 63), with slightly increased surrounding
patchy ground-glass opacity and interlobular septal thickening.
Worsening passive atelectasis in the dependent lower lobes
bilaterally, right greater than left.

Upper abdomen: Hypodense ill-defined 2.8 cm right liver dome mass
(series 8/image 89), increased from 2.1 cm. Cholecystectomy.
Hypodense 2.3 cm left adrenal nodule, stable.

Musculoskeletal: Lytic expansile metastases in the right glenoid and
left anterolateral fourth and fifth ribs have mildly increased.
Poorly marginated lytic osseous metastases in the T8 and T9
vertebral bodies and L1 vertebral body appears similar. Marked
thoracic spondylosis.

Review of the MIP images confirms the above findings.
IMPRESSION: 1. No pulmonary embolism.
2. Small dependent pleural effusions bilaterally, right greater than
left, increased.
3. Poorly marginated right perihilar lung mass is increased,
compatible with known small cell malignancy. Stable ill-defined
masslike consolidation throughout the dependent peripheral right
upper lobe with slightly increased surrounding patchy ground-glass
opacity and interlobular septal thickening, suggesting worsening
lymphangitic tumor.
4. Stable infiltrative bulky mediastinal lymphadenopathy.
5. Interval growth of right liver dome metastasis.
6. Mildly enlarging lytic expansile osseous metastases in the right
glenoid and left anterolateral fourth and fifth ribs. Stable
ill-defined lytic osseous metastases in the T8, T9 and L1 vertebral
bodies.
7. Stable indeterminate left adrenal lesion, metastasis not
excluded.
8. Aortic Atherosclerosis ([ZO]-[ZO]) and Emphysema ([ZO]-[ZO]).

## 2019-06-06 IMAGING — CR DG CHEST 1V PORT
1 series · 1 of 1 positions shown · non-contrast
Comparison: [DATE], chest radiograph and chest CT.

CLINICAL DATA: Short of breath. History of metastatic lung
carcinoma.

EXAM:
PORTABLE CHEST 1 VIEW

[portable]
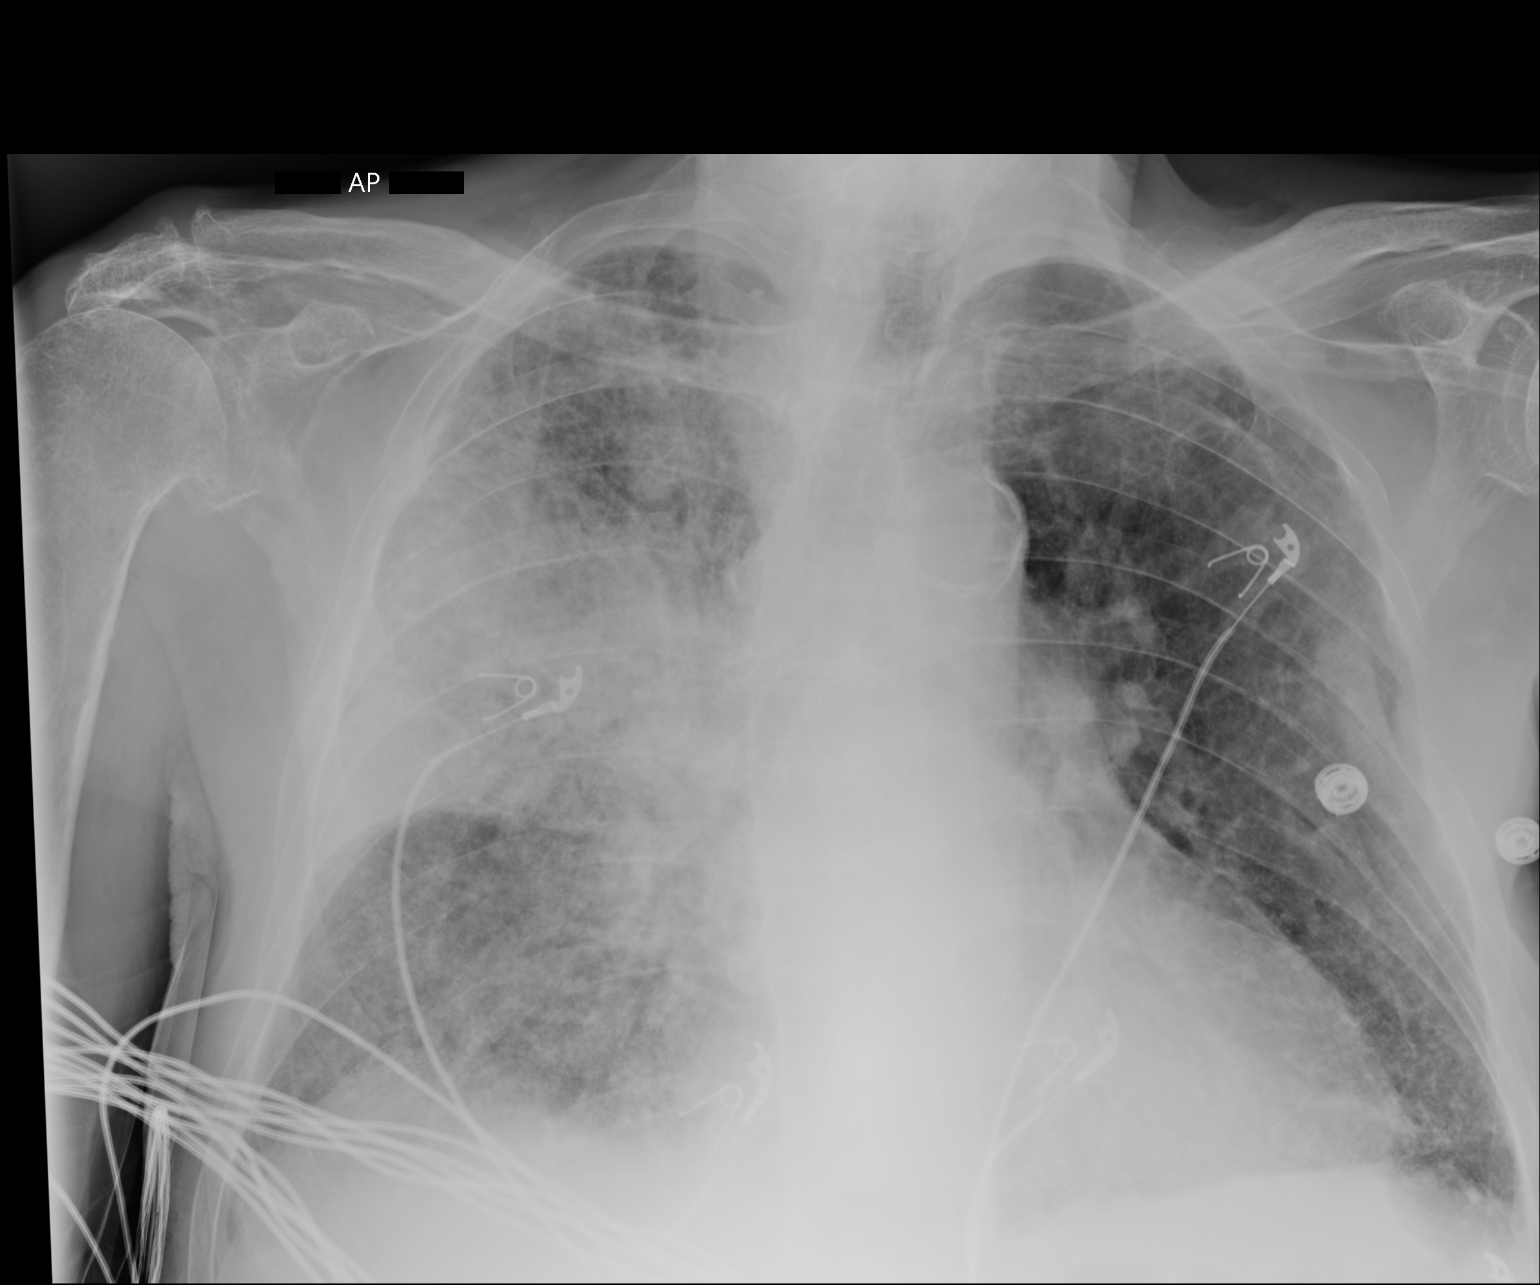

[1 of 1 positions shown; findings below may reference images not displayed]

FINDINGS: Dense consolidation in the right upper lobe extends from the right
hilum, bordering on the minor fissure. Interstitial and hazy
airspace lung opacities are noted elsewhere throughout the right
lung. There are prominent interstitial markings in the left lung
most evident at the base. No left lung consolidation.

No convincing pleural effusion and no pneumothorax.

There are destructive lesions of the left lateral fourth and fifth
ribs.
IMPRESSION: 1. Interval worsening. Opacity extending from the right hilum is
more confluent, and there is now extensive ground-glass opacity and
interstitial opacities throughout the remainder of the right lung,
which could reflect superimposed pneumonia, postobstructive changes
or a combination.
2. Lytic left-sided rib lesions have become more apparent than on
the prior portable chest radiograph.

## 2019-06-06 MED ORDER — ENOXAPARIN SODIUM 40 MG/0.4ML ~~LOC~~ SOLN
40.0000 mg | SUBCUTANEOUS | Status: DC
  Administered 2019-06-06: 40 mg via SUBCUTANEOUS
  Filled 2019-06-06: qty 0.4

## 2019-06-06 MED ORDER — DIGOXIN 125 MCG PO TABS
125.0000 ug | ORAL_TABLET | Freq: Every day | ORAL | Status: DC
  Administered 2019-06-07 – 2019-06-09 (×3): 125 ug via ORAL
  Filled 2019-06-06 (×3): qty 1

## 2019-06-06 MED ORDER — ONDANSETRON HCL 4 MG/2ML IJ SOLN: 4.0000 mg | Freq: Four times a day (QID) | INTRAMUSCULAR | Status: DC | PRN

## 2019-06-06 MED ORDER — TAMSULOSIN HCL 0.4 MG PO CAPS
0.4000 mg | ORAL_CAPSULE | Freq: Every evening | ORAL | Status: DC
  Administered 2019-06-06 – 2019-06-08 (×3): 0.4 mg via ORAL
  Filled 2019-06-06 (×3): qty 1

## 2019-06-06 MED ORDER — IOHEXOL 350 MG/ML SOLN
100.0000 mL | Freq: Once | INTRAVENOUS | Status: AC | PRN
  Administered 2019-06-06: 100 mL via INTRAVENOUS

## 2019-06-06 MED ORDER — ADULT MULTIVITAMIN W/MINERALS CH
1.0000 | ORAL_TABLET | Freq: Every day | ORAL | Status: DC
  Administered 2019-06-07 – 2019-06-09 (×3): 1 via ORAL
  Filled 2019-06-06 (×3): qty 1

## 2019-06-06 MED ORDER — SODIUM CHLORIDE 0.9 % IV SOLN
2.0000 g | Freq: Two times a day (BID) | INTRAVENOUS | Status: DC
  Administered 2019-06-06 – 2019-06-09 (×6): 2 g via INTRAVENOUS
  Filled 2019-06-06 (×7): qty 2

## 2019-06-06 MED ORDER — ACETAMINOPHEN 650 MG RE SUPP: 650.0000 mg | Freq: Four times a day (QID) | RECTAL | Status: DC | PRN

## 2019-06-06 MED ORDER — IPRATROPIUM-ALBUTEROL 20-100 MCG/ACT IN AERS
1.0000 | INHALATION_SPRAY | Freq: Four times a day (QID) | RESPIRATORY_TRACT | Status: DC
  Administered 2019-06-06 – 2019-06-07 (×3): 1 via RESPIRATORY_TRACT
  Filled 2019-06-06: qty 4

## 2019-06-06 MED ORDER — METHYLPREDNISOLONE SODIUM SUCC 125 MG IJ SOLR
60.0000 mg | Freq: Four times a day (QID) | INTRAMUSCULAR | Status: AC
  Administered 2019-06-06 – 2019-06-09 (×12): 60 mg via INTRAVENOUS
  Filled 2019-06-06 (×12): qty 2

## 2019-06-06 MED ORDER — METHYLPREDNISOLONE SODIUM SUCC 125 MG IJ SOLR
80.0000 mg | Freq: Once | INTRAMUSCULAR | Status: AC
  Administered 2019-06-06: 80 mg via INTRAVENOUS
  Filled 2019-06-06: qty 2

## 2019-06-06 MED ORDER — POTASSIUM CHLORIDE CRYS ER 20 MEQ PO TBCR
40.0000 meq | EXTENDED_RELEASE_TABLET | Freq: Once | ORAL | Status: AC
  Administered 2019-06-06: 40 meq via ORAL
  Filled 2019-06-06: qty 2

## 2019-06-06 MED ORDER — ACETAMINOPHEN 325 MG PO TABS: 650.0000 mg | ORAL_TABLET | Freq: Four times a day (QID) | ORAL | Status: DC | PRN

## 2019-06-06 MED ORDER — ONDANSETRON HCL 4 MG PO TABS: 4.0000 mg | ORAL_TABLET | Freq: Four times a day (QID) | ORAL | Status: DC | PRN

## 2019-06-06 MED ORDER — ALBUTEROL SULFATE HFA 108 (90 BASE) MCG/ACT IN AERS
4.0000 | INHALATION_SPRAY | Freq: Once | RESPIRATORY_TRACT | Status: AC
  Administered 2019-06-06: 4 via RESPIRATORY_TRACT
  Filled 2019-06-06: qty 6.7

## 2019-06-06 MED ORDER — ENSURE ENLIVE PO LIQD
237.0000 mL | Freq: Two times a day (BID) | ORAL | Status: DC
  Administered 2019-06-06 – 2019-06-07 (×2): 237 mL via ORAL

## 2019-06-06 MED ORDER — TRAZODONE HCL 50 MG PO TABS
50.0000 mg | ORAL_TABLET | ORAL | Status: DC
  Administered 2019-06-06 – 2019-06-08 (×6): 50 mg via ORAL
  Filled 2019-06-06 (×6): qty 1

## 2019-06-06 MED ORDER — OXYCODONE HCL 5 MG PO TABS
10.0000 mg | ORAL_TABLET | ORAL | Status: DC | PRN
  Administered 2019-06-06 – 2019-06-09 (×10): 10 mg via ORAL
  Filled 2019-06-06 (×10): qty 2

## 2019-06-06 NOTE — H&P (Signed)
History and Physical  Dannie Hattabaugh JAS:[id number] DOB: [birth year] DOA: 06/06/2019   PCP: Wannetta Sender, FNP   Patient coming from: Home  Chief Complaint: dyspnea  HPI:  Nasser Ku is a 86 y.o. male with medical history of extensive stage small cell lung cancer, diabetes mellitus type 2, and BPH presenting with 2-day history of shortness of breath.  Patient was recently diagnosed with small cell lung cancer in the first week of March after liver biopsy.  He was started on atezolizumab, carboplatin, and etoposide with his last treatment on 06/05/2019.  He denied any fevers, chills, chest pain, nausea, vomiting, diarrhea, abdominal pain, dysuria, hematuria, headache.  He has a nonproductive cough.  He denies any hemoptysis.  He denies any hematochezia or melena.  He denies any worsening lower extremity edema orthopnea type symptoms.  He is not normally on home oxygen.  He quit smoking in 1991 after approximately 80-pack-year history.  He denies any new medications.  Because of worsening shortness of breath presented for further evaluation In the emergency department, the patient was afebrile hemodynamically stable with oxygen saturation 75% on room air.  BMP showed potassium 3.1 with serum creatinine 0.52.  WBC 11.0, hemoglobin 14.0, platelets 273,000.  CT angiogram chest was negative for pulmonary embolus but showed right paratracheal lymph node and subcarinal lymph node.  There is also small bilateral pleural effusions, right greater than left.  There was a right perihilar mass that increased in size since his previous CT.  There was a stable ill-defined masslike density and consolidation in the right upper lobe with increasing surrounding groundglass opacity and interlobular thickening suggesting lymphangitic spread.  There is osseous mets to there is right glenoid and left fourth and fifth ribs. Because of his worsening shortness of breath and hypoxia he was admitted for further  diagnosis and treatment. Assessment/Plan: Acute respiratory failure with hypoxia -Multifactorial including lymphangitic spread from his lung cancer, COPD exacerbation, postobstructive pneumonia -Presently stable on 4 L nasal cannula -Wean oxygen as tolerated for saturation greater 90%  COPD exacerbation -Start duo nebs if COVID-19 test is negative -Start IV Solu-Medrol  Postobstructive pneumonia -Start cefepime -Check procalcitonin -MRSA screen  Extensive stage small cell lung cancer -Patient has metastasis to the vertebrae, ribs, glenoid, and liver -Last chemotherapy 06/05/2019 -CTA chest suggest lymphangitic spread -Follow-up Dr. Delton Coombes  Diabetes mellitus type 2 -Allow for liberal glycemic control at this point  Hypokalemia -Replete -Check magnesium  Neoplastic associated pain -Continue home dose oxycodone  Goals of care discussion -Discuss goals of care with spouse and patient--> confirms DNR -Advance care planning, including the explanation and discussion of advance directives was carried out with the patient and family.  Code status including explanations of "Full Code" and "DNR" and alternatives were discussed in detail.  Discussion of end-of-life issues including but not limited palliative care, hospice care and the concept of hospice, other end-of-life care options, power of attorney for health care decisions, living wills, and physician orders for life-sustaining treatment were also discussed with the patient and family.  Total face to face time 16 minutes.        Past Medical History:  Diagnosis Date  . Arthritis   . BPH (benign prostatic hyperplasia)   . Cancer ([place])   . Diabetes mellitus without complication [facility])    Past Surgical History:  Procedure Laterality Date  . CHOLECYSTECTOMY     Social History:  reports that he has quit smoking. His smokeless tobacco use includes  chew. He reports that he does not drink alcohol or use drugs.   History  reviewed. No pertinent family history.   Allergies  Allergen Reactions  . Amoxicillin Hives and Swelling    Did it involve swelling of the face/tongue/throat, SOB, or low BP? Yes Did it involve sudden or severe rash/hives, skin peeling, or any reaction on the inside of your mouth or nose? Yes Did you need to seek medical attention at a hospital or doctor's office? Yes When did it last happen?20 + years If all above answers are "NO", may proceed with cephalosporin use.   . Codeine Swelling     Prior to Admission medications   Medication Sig Start Date End Date Taking? Authorizing Provider  alum & mag hydroxide-simeth (MAALOX/MYLANTA) 200-200-20 MG/5ML suspension Take 15 mLs by mouth daily as needed for indigestion or heartburn.    [provider]  atezolizumab 1,200 mg in sodium chloride 0.9 % 250 mL Inject 1,200 mg into the vein every 21 ( twenty-one) days. 06/03/19   [provider]  calcium carbonate (TUMS - DOSED IN MG ELEMENTAL CALCIUM) 500 MG chewable tablet Chew 2 tablets by mouth 2 (two) times daily as needed for indigestion or heartburn.    [provider]  CARBOplatin in sodium chloride 0.9 % 100 mL Inject into the vein every 21 ( twenty-one) days.    [provider]  celecoxib (CELEBREX) 200 MG capsule Take 200 mg by mouth daily. 04/28/19   [provider]  cetirizine (ZYRTEC) 10 MG tablet Take 10 mg by mouth daily.    [provider]  cholecalciferol (VITAMIN D3) 25 MCG (1000 UNIT) tablet Take 1,000 Units by mouth daily.    [provider]  digoxin (LANOXIN) 0.125 MG tablet Take 125 mcg by mouth daily. 04/30/19   [provider]  ETOPOSIDE IV Inject 100 mg/m2 into the vein every 21 ( twenty-one) days. 06/14/19   [provider]  lidocaine-prilocaine (EMLA) cream Apply a pea-sized amount to port a cath site and cover with plastic wrap 1 hour prior to chemotherapy appointments 06/03/19   Derek Jack, MD  metFORMIN (GLUCOPHAGE) 500 MG tablet Take 500 mg by mouth 2 (two) times daily. 04/16/19   [provider]  Multiple Vitamin (MULTIVITAMIN WITH MINERALS) TABS tablet Take 1 tablet by mouth daily.    [provider]  Oxycodone HCl 10 MG TABS Take 1 tablet (10 mg total) by mouth every 4 (four) hours as needed (for pain). 06/01/19   Derek Jack, MD  Polyvinyl Alcohol-Povidone (REFRESH OP) Place 1 drop into both eyes daily as needed (dry eyes).    [provider]  prochlorperazine (COMPAZINE) 10 MG tablet Take 1 tablet (10 mg total) by mouth every 6 (six) hours as needed (Nausea or vomiting). 06/02/19   Derek Jack, MD  tamsulosin (FLOMAX) 0.4 MG CAPS capsule Take 0.4 mg by mouth every evening.  03/18/19   [provider]  traZODone (DESYREL) 50 MG tablet Take 50 mg by mouth in the morning and at bedtime. At dinner and bedtime 04/30/19   [provider]    Review of Systems:  Constitutional:  No weight loss, night sweats, Fevers, chills, Head&Eyes: No headache.  No vision loss.  No eye pain or scotoma ENT:  No Difficulty swallowing,Tooth/dental problems,Sore throat,  No ear ache, post nasal drip,  Cardio-vascular:  No chest pain, Orthopnea, PND, swelling in lower extremities,  dizziness, palpitations  GI:  No  abdominal pain, nausea, vomiting,  diarrhea, loss of appetite, hematochezia, melena, heartburn, indigestion, Resp:  No coughing up of blood .No wheezing.No chest wall deformity  Skin:  no rash or lesions.  GU:  no dysuria, change in color of urine, no urgency or frequency. No flank pain.  Musculoskeletal:  No joint pain or swelling. No decreased range of motion. No back pain.  Psych:  No change in mood or affect. No depression or anxiety. Neurologic: No headache, no dysesthesia, no focal weakness, no vision loss. No syncope  Physical Exam: Vitals:   06/06/19 1025 06/06/19 1030 06/06/19 1100 06/06/19 1130  BP:   113/68 (!) 107/53 111/65  Pulse:  94 100 95  Resp:  (!) 22 17 (!) 22  Temp:      TempSrc:      SpO2: 97% 99% 96% 95%  Weight:      Height:       General:  A&O x 3, NAD, nontoxic, pleasant/cooperative Head/Eye: No conjunctival hemorrhage, no icterus, /AT, No nystagmus ENT:  No icterus,  No thrush, good dentition, no pharyngeal exudate Neck:  No masses, no lymphadenpathy, no bruits CV:  RRR, no rub, no gallop, no S3 Lung:  Bilateral scattered rhonchi.  Mild basilar wheeze Abdomen: soft/NT, +BS, nondistended, no peritoneal signs Ext: No cyanosis, No rashes, No petechiae, No lymphangitis, No edema Neuro: CNII-XII intact, strength 4/5 in bilateral upper and lower extremities, no dysmetria  Labs on Admission:  Basic Metabolic Panel: Recent Labs  Lab 06/03/19 0906 06/06/19 0940  NA 135 134*  K 3.4* 3.1*  CL 95* 96*  CO2 29 29  GLUCOSE 150* 114*  BUN 11 16  CREATININE 0.59* 0.52*  CALCIUM 9.6 9.0  MG 1.7  --    Liver Function Tests: Recent Labs  Lab 06/03/19 0906  AST 23  ALT 12  ALKPHOS 105  BILITOT 0.6  PROT 7.3  ALBUMIN 3.1*   No results for input(s): LIPASE, AMYLASE in the last 168 hours. No results for input(s): AMMONIA in the last 168 hours. CBC: Recent Labs  Lab 06/03/19 0906 06/06/19 0940  WBC 9.5 11.0*  NEUTROABS 7.4 9.6*  HGB 14.5 14.0  HCT 45.6 45.2  MCV 90.5 92.1  PLT 433* 373   Coagulation Profile: No results for input(s): INR, PROTIME in the last 168 hours. Cardiac Enzymes: No results for input(s): CKTOTAL, CKMB, CKMBINDEX, TROPONINI in the last 168 hours. BNP: Invalid input(s): POCBNP CBG: No results for input(s): GLUCAP in the last 168 hours. Urine analysis:    Component Value Date/Time   COLORURINE YELLOW 05/10/2019 1350   APPEARANCEUR CLEAR 05/10/2019 1350   LABSPEC 1.005 05/10/2019 1350   PHURINE 6.0 05/10/2019 1350   GLUCOSEU NEGATIVE 05/10/2019 1350   HGBUR NEGATIVE 05/10/2019 [place] 05/10/2019 1350    KETONESUR NEGATIVE 05/10/2019 1350   PROTEINUR NEGATIVE 05/10/2019 1350   NITRITE NEGATIVE 05/10/2019 1350   LEUKOCYTESUR NEGATIVE 05/10/2019 1350   Sepsis Labs: @LABRCNTIP (procalcitonin:4,lacticidven:4) )No results found for this or any previous visit (from the past 240 hour(s)).   Radiological Exams on Admission: CT Angio Chest PE W and/or Wo Contrast  Result Date: 06/06/2019 CLINICAL DATA:  Dyspnea for 2 days. Recent diagnosis of small cell lung cancer, initiated chemotherapy this week. EXAM: CT ANGIOGRAPHY CHEST WITH CONTRAST TECHNIQUE: Multidetector CT imaging of the chest was performed using the standard protocol during bolus administration of intravenous contrast. Multiplanar CT image reconstructions and MIPs were obtained to evaluate the vascular anatomy. CONTRAST:  189mL OMNIPAQUE IOHEXOL 350 MG/ML SOLN  COMPARISON:  05/10/2019 chest CT angiogram. FINDINGS: Cardiovascular: The study is high quality for the evaluation of pulmonary embolism. There are no filling defects in the central, lobar, segmental or subsegmental pulmonary artery branches to suggest acute pulmonary embolism. Atherosclerotic nonaneurysmal thoracic aorta. Normal caliber main pulmonary artery. Top-normal heart size. Trace pericardial effusion/thickening is unchanged. Left anterior descending coronary atherosclerosis. Mediastinum/Nodes: Heterogeneous thyroid gland without discrete thyroid nodules. Unremarkable esophagus. No axillary adenopathy. Right paratracheal adenopathy up to 2.9 cm short axis diameter (series 8/image 30), stable using similar measurement technique. Enlarged 2.0 cm subcarinal node (series 8/image 52), stable. No left hilar adenopathy. Lungs/Pleura: No pneumothorax. Small dependent pleural effusions bilaterally, right greater than left, increased. Moderate centrilobular and paraseptal emphysema. Poorly marginated right perihilar 7.6 x 4.9 cm lung mass (series 9/image 150), increased from 6.3 x 3.9 cm. Stable  ill-defined masslike consolidation throughout the dependent peripheral right upper lobe measuring up to 8.7 cm (series 10/image 63), with slightly increased surrounding patchy ground-glass opacity and interlobular septal thickening. Worsening passive atelectasis in the dependent lower lobes bilaterally, right greater than left. Upper abdomen: Hypodense ill-defined 2.8 cm right liver dome mass (series 8/image 89), increased from 2.1 cm. Cholecystectomy. Hypodense 2.3 cm left adrenal nodule, stable. Musculoskeletal: Lytic expansile metastases in the right glenoid and left anterolateral fourth and fifth ribs have mildly increased. Poorly marginated lytic osseous metastases in the T8 and T9 vertebral bodies and L1 vertebral body appears similar. Marked thoracic spondylosis. Review of the MIP images confirms the above findings. IMPRESSION: 1. No pulmonary embolism. 2. Small dependent pleural effusions bilaterally, right greater than left, increased. 3. Poorly marginated right perihilar lung mass is increased, compatible with known small cell malignancy. Stable ill-defined masslike consolidation throughout the dependent peripheral right upper lobe with slightly increased surrounding patchy ground-glass opacity and interlobular septal thickening, suggesting worsening lymphangitic tumor. 4. Stable infiltrative bulky mediastinal lymphadenopathy. 5. Interval growth of right liver dome metastasis. 6. Mildly enlarging lytic expansile osseous metastases in the right glenoid and left anterolateral fourth and fifth ribs. Stable ill-defined lytic osseous metastases in the T8, T9 and L1 vertebral bodies. 7. Stable indeterminate left adrenal lesion, metastasis not excluded. 8. Aortic Atherosclerosis (ICD10-I70.0) and Emphysema (ICD10-J43.9). Electronically Signed   By: Ilona Sorrel M.D.   On: 06/06/2019 12:29   DG Chest Port 1 View  Result Date: 06/06/2019 CLINICAL DATA:  Short of breath. History of metastatic lung carcinoma.  EXAM: PORTABLE CHEST 1 VIEW COMPARISON:  05/10/2019, chest radiograph and chest CT. FINDINGS: Dense consolidation in the right upper lobe extends from the right hilum, bordering on the minor fissure. Interstitial and hazy airspace lung opacities are noted elsewhere throughout the right lung. There are prominent interstitial markings in the left lung most evident at the base. No left lung consolidation. No convincing pleural effusion and no pneumothorax. There are destructive lesions of the left lateral fourth and fifth ribs. IMPRESSION: 1. Interval worsening. Opacity extending from the right hilum is more confluent, and there is now extensive ground-glass opacity and interstitial opacities throughout the remainder of the right lung, which could reflect superimposed pneumonia, postobstructive changes or a combination. 2. Lytic left-sided rib lesions have become more apparent than on the prior portable chest radiograph. Electronically Signed   By: Lajean Manes M.D.   On: 06/06/2019 10:02        Time spent:60 minutes Code Status:   dnr Family Communication:  Spouse updated 3/13 Disposition Plan: expect 2-3 day hospitalization Consults called: none DVT Prophylaxis: [place] Lovenox  Orson Eva, DO  [facility] Pager [phone]  If 7PM-7AM, please contact night-coverage [email or web address] Password [facility] 06/06/2019, 1:49 PM

## 2019-06-06 NOTE — ED Notes (Signed)
Pt placed on 4L [place] with improvement to 95%.

## 2019-06-06 NOTE — ED Triage Notes (Signed)
Pt reports shortness of breath since yesterday afternoon, minimal cough.

## 2019-06-06 NOTE — Progress Notes (Signed)
Pt arrived to room #328 via stretcher from ED accompanied by his wife. Moved to bed without difficulty. Oriented to room and safety requirements. VSS.

## 2019-06-06 NOTE — ED Provider Notes (Signed)
[facility] EMERGENCY DEPARTMENT Provider Note   CSN: [id number] Arrival date & time: 06/06/19  9381     History Chief Complaint  Patient presents with  . Shortness of Breath    Dustin Shaw is a 86 y.o. male.  Patient is an 86 year old male with history of metastatic small cell lung cancer, diabetes, atrial fibrillation.  He presents today for evaluation of shortness of breath.  His breathing has been worsening over the past several days.  Patient denies fevers or chills.  He does report some wheezing and cough.  He denies Covid exposure, and has not received the vaccine.  The history is provided by the patient.  Shortness of Breath Severity:  Moderate Onset quality:  Gradual Duration:  3 days Timing:  Constant Progression:  Worsening Chronicity:  New Context: activity and URI   Relieved by:  Nothing Worsened by:  Exertion and movement Ineffective treatments:  None tried Associated symptoms: cough   Associated symptoms: no chest pain and no fever        Past Medical History:  Diagnosis Date  . Arthritis   . BPH (benign prostatic hyperplasia)   . Cancer ([place])   . Diabetes mellitus without complication [facility])     Patient Active Problem List   Diagnosis Date Noted  . Small cell lung cancer ([place]) 06/02/2019  . Goals of care, counseling/discussion 06/02/2019  . Metastatic lung cancer (metastasis from lung to other site), right ([place]) 05/15/2019    Past Surgical History:  Procedure Laterality Date  . CHOLECYSTECTOMY         History reviewed. No pertinent family history.  Social History   Tobacco Use  . Smoking status: Former Research scientist (life sciences)  . Smokeless tobacco: Current User    Types: Chew  Substance Use Topics  . Alcohol use: Never  . Drug use: Never    Home Medications Prior to Admission medications   Medication Sig Start Date End Date Taking? Authorizing Provider  alum & mag hydroxide-simeth (MAALOX/MYLANTA) 200-200-20 MG/5ML suspension Take 15 mLs by  mouth daily as needed for indigestion or heartburn.    [provider]  atezolizumab 1,200 mg in sodium chloride 0.9 % 250 mL Inject 1,200 mg into the vein every 21 ( twenty-one) days. 06/03/19   [provider]  calcium carbonate (TUMS - DOSED IN MG ELEMENTAL CALCIUM) 500 MG chewable tablet Chew 2 tablets by mouth 2 (two) times daily as needed for indigestion or heartburn.    [provider]  CARBOplatin in sodium chloride 0.9 % 100 mL Inject into the vein every 21 ( twenty-one) days.    [provider]  celecoxib (CELEBREX) 200 MG capsule Take 200 mg by mouth daily. 04/28/19   [provider]  cetirizine (ZYRTEC) 10 MG tablet Take 10 mg by mouth daily.    [provider]  cholecalciferol (VITAMIN D3) 25 MCG (1000 UNIT) tablet Take 1,000 Units by mouth daily.    [provider]  digoxin (LANOXIN) 0.125 MG tablet Take 125 mcg by mouth daily. 04/30/19   [provider]  ETOPOSIDE IV Inject 100 mg/m2 into the vein every 21 ( twenty-one) days. 06/14/19   [provider]  lidocaine-prilocaine (EMLA) cream Apply a pea-sized amount to port a cath site and cover with plastic wrap 1 hour prior to chemotherapy appointments 06/03/19   Derek Jack, MD  metFORMIN (GLUCOPHAGE) 500 MG tablet Take 500 mg by mouth 2 (two) times daily. 04/16/19   [provider]  Multiple Vitamin (MULTIVITAMIN  WITH MINERALS) TABS tablet Take 1 tablet by mouth daily.    [provider]  Oxycodone HCl 10 MG TABS Take 1 tablet (10 mg total) by mouth every 4 (four) hours as needed (for pain). 06/01/19   Derek Jack, MD  Polyvinyl Alcohol-Povidone (REFRESH OP) Place 1 drop into both eyes daily as needed (dry eyes).    [provider]  prochlorperazine (COMPAZINE) 10 MG tablet Take 1 tablet (10 mg total) by mouth every 6 (six) hours as needed (Nausea or vomiting). 06/02/19   Derek Jack, MD  tamsulosin (FLOMAX) 0.4 MG  CAPS capsule Take 0.4 mg by mouth every evening.  03/18/19   [provider]  traZODone (DESYREL) 50 MG tablet Take 50 mg by mouth in the morning and at bedtime. At dinner and bedtime 04/30/19   [provider]    Allergies    Amoxicillin and Codeine  Review of Systems   Review of Systems  Constitutional: Negative for fever.  Respiratory: Positive for cough and shortness of breath.   Cardiovascular: Negative for chest pain.  All other systems reviewed and are negative.   Physical Exam Updated Vital Signs BP 115/66 (BP Location: Left Arm)   Pulse (!) 114   Temp 98.3 F (36.8 C) (Oral)   Resp (!) 22   Ht 5\' 6"  (1.676 m)   Wt 56.9 kg   SpO2 (!) 75%   BMI 20.25 kg/m   Physical Exam Vitals and nursing note reviewed.  Constitutional:      General: He is not in acute distress.    Appearance: He is well-developed. He is not diaphoretic.     Comments: Patient is a chronically ill-appearing 86 year old male.   HENT:     Head: Normocephalic and atraumatic.  Cardiovascular:     Rate and Rhythm: Normal rate and regular rhythm.     Heart sounds: No murmur. No friction rub.  Pulmonary:     Effort: Tachypnea and respiratory distress present.     Breath sounds: Examination of the right-middle field reveals rhonchi. Examination of the left-middle field reveals rhonchi. Rhonchi present. No wheezing or rales.     Comments: He is tachypneic and in mild respiratory distress. Abdominal:     General: Bowel sounds are normal. There is no distension.     Palpations: Abdomen is soft.     Tenderness: There is no abdominal tenderness.  Musculoskeletal:        General: Normal range of motion.     Cervical back: Normal range of motion and neck supple.  Skin:    General: Skin is warm and dry.  Neurological:     Mental Status: He is alert and oriented to person, place, and time.     Coordination: Coordination normal.     ED Results / Procedures / Treatments   Labs (all labs  ordered are listed, but only abnormal results are displayed) Labs Reviewed  BASIC METABOLIC PANEL  CBC WITH DIFFERENTIAL/PLATELET  BRAIN NATRIURETIC PEPTIDE  DIGOXIN LEVEL  TROPONIN I (HIGH SENSITIVITY)    EKG EKG Interpretation  Date/Time:  Saturday June 06 2019 09:33:32 EST Ventricular Rate:  99 PR Interval:    QRS Duration: 85 QT Interval:  347 QTC Calculation: 446 R Axis:   -30 Text Interpretation: Atrial fibrillation Ventricular premature complex Left axis deviation Low voltage, extremity leads Baseline wander in lead(s) V1 V6 No significant change since 05/10/2019 Confirmed by Veryl Speak [phone]) on 06/06/2019 9:36:54 AM   Radiology No results found.  Procedures Procedures (including critical care time)  Medications Ordered in ED Medications  albuterol (VENTOLIN HFA) 108 (90 Base) MCG/ACT inhaler 4 puff (has no administration in time range)    ED Course  I have reviewed the triage vital signs and the nursing notes.  Pertinent labs & imaging results that were available during my care of the patient were reviewed by me and considered in my medical decision making (see chart for details).    MDM Rules/Calculators/A&P  Patient presenting here with complaints of shortness of breath.  He has history of metastatic lung cancer that appears to be progressing based on the results of today's studies.  His chest x-ray looks significantly worse and CT scan of the chest shows progression.  Laboratory studies otherwise unremarkable.  Patient to be admitted due to his significant oxygen requirement.  I spoke with Dr. Carles Collet who agrees to admit.  CRITICAL CARE Performed by: Veryl Speak Total critical care time: 35 minutes Critical care time was exclusive of separately billable procedures and treating other patients. Critical care was necessary to treat or prevent imminent or life-threatening deterioration. Critical care was time spent personally by me on the following activities:  development of treatment plan with patient and/or surrogate as well as nursing, discussions with consultants, evaluation of patient's response to treatment, examination of patient, obtaining history from patient or surrogate, ordering and performing treatments and interventions, ordering and review of laboratory studies, ordering and review of radiographic studies, pulse oximetry and re-evaluation of patient's condition.   Final Clinical Impression(s) / ED Diagnoses Final diagnoses:  None    Rx / DC Orders ED Discharge Orders    None       Veryl Speak, MD 06/07/19 [phone]

## 2019-06-07 ENCOUNTER — Other Ambulatory Visit: Payer: Self-pay

## 2019-06-07 DIAGNOSIS — J189 Pneumonia, unspecified organism: Secondary | ICD-10-CM

## 2019-06-07 DIAGNOSIS — I4891 Unspecified atrial fibrillation: Secondary | ICD-10-CM

## 2019-06-07 LAB — MRSA PCR SCREENING: MRSA by PCR: NEGATIVE

## 2019-06-07 LAB — CBC
HCT: 38.5 % — ABNORMAL LOW (ref 39.0–52.0)
Hemoglobin: 12 g/dL — ABNORMAL LOW (ref 13.0–17.0)
MCH: 28.1 pg (ref 26.0–34.0)
MCHC: 31.2 g/dL (ref 30.0–36.0)
MCV: 90.2 fL (ref 80.0–100.0)
Platelets: 261 10*3/uL (ref 150–400)
RBC: 4.27 MIL/uL (ref 4.22–5.81)
RDW: 14.8 % (ref 11.5–15.5)
WBC: 6.4 10*3/uL (ref 4.0–10.5)
nRBC: 0 % (ref 0.0–0.2)

## 2019-06-07 LAB — COMPREHENSIVE METABOLIC PANEL
ALT: 14 U/L (ref 0–44)
AST: 22 U/L (ref 15–41)
Albumin: 2.6 g/dL — ABNORMAL LOW (ref 3.5–5.0)
Alkaline Phosphatase: 77 U/L (ref 38–126)
Anion gap: 6 (ref 5–15)
BUN: 16 mg/dL (ref 8–23)
CO2: 31 mmol/L (ref 22–32)
Calcium: 8.4 mg/dL — ABNORMAL LOW (ref 8.9–10.3)
Chloride: 99 mmol/L (ref 98–111)
Creatinine, Ser: 0.42 mg/dL — ABNORMAL LOW (ref 0.61–1.24)
GFR calc Af Amer: >60 mL/min (ref >60)
GFR calc non Af Amer: >60 mL/min (ref >60)
Glucose, Bld: 144 mg/dL — ABNORMAL HIGH (ref 70–99)
Potassium: 4.3 mmol/L (ref 3.5–5.1)
Sodium: 136 mmol/L (ref 135–145)
Total Bilirubin: 0.7 mg/dL (ref 0.3–1.2)
Total Protein: 5.7 g/dL — ABNORMAL LOW (ref 6.5–8.1)

## 2019-06-07 LAB — SARS CORONAVIRUS 2 (TAT 6-24 HRS): SARS Coronavirus 2: NEGATIVE

## 2019-06-07 LAB — MAGNESIUM: Magnesium: 1.5 mg/dL — ABNORMAL LOW (ref 1.7–2.4)

## 2019-06-07 MED ORDER — APIXABAN 5 MG PO TABS
5.0000 mg | ORAL_TABLET | Freq: Two times a day (BID) | ORAL | Status: DC
  Administered 2019-06-07 – 2019-06-09 (×5): 5 mg via ORAL
  Filled 2019-06-07 (×5): qty 1

## 2019-06-07 MED ORDER — IPRATROPIUM-ALBUTEROL 0.5-2.5 (3) MG/3ML IN SOLN
3.0000 mL | Freq: Four times a day (QID) | RESPIRATORY_TRACT | Status: DC
  Administered 2019-06-07 (×2): 3 mL via RESPIRATORY_TRACT
  Filled 2019-06-07 (×2): qty 3

## 2019-06-07 NOTE — Plan of Care (Signed)

## 2019-06-07 NOTE — Progress Notes (Signed)
Central tele called to report pt with afib RVR 128 - 130. Upon assessment, pt moving from [facility] to bed after large BM. Pt denies c/o. With rest, pt's HR down to 90's afib, no ectopy noted.

## 2019-06-07 NOTE — Plan of Care (Signed)
  Problem: Education: Goal: Knowledge of General Education information will improve Description: Including pain rating scale, medication(s)/side effects and non-pharmacologic comfort measures Outcome: Progressing   Problem: Clinical Measurements: Goal: Will remain free from infection Outcome: Progressing   Problem: Clinical Measurements: Goal: Respiratory complications will improve Outcome: Progressing   Problem: Activity: Goal: Risk for activity intolerance will decrease Outcome: Progressing   Problem: Nutrition: Goal: Adequate nutrition will be maintained Outcome: Progressing   Problem: Pain Managment: Goal: General experience of comfort will improve Outcome: Progressing   Problem: Elimination: Goal: Will not experience complications related to bowel motility Outcome: Progressing   Problem: Skin Integrity: Goal: Risk for impaired skin integrity will decrease Outcome: Progressing   Problem: Safety: Goal: Ability to remain free from injury will improve Outcome: Progressing

## 2019-06-07 NOTE — Discharge Instructions (Signed)

## 2019-06-07 NOTE — Progress Notes (Signed)
PROGRESS NOTE  Dustin Shaw OXB:[id number] DOB: Aug 02, 1932 DOA: 06/06/2019 PCP: Dustin Sender, FNP  Brief History:  86 y.o. male with medical history of extensive stage small cell lung cancer, diabetes mellitus type 2, and BPH presenting with 2-day history of shortness of breath.  Patient was recently diagnosed with small cell lung cancer in the first week of March after liver biopsy.  He was started on atezolizumab, carboplatin, and etoposide with his last treatment on 06/05/2019.  He denied any fevers, chills, chest pain, nausea, vomiting, diarrhea, abdominal pain, dysuria, hematuria, headache.  He has a nonproductive cough.  He denies any hemoptysis.  He denies any hematochezia or melena.  He denies any worsening lower extremity edema orthopnea type symptoms.  He is not normally on home oxygen.  He quit smoking in 1991 after approximately 80-pack-year history.  He denies any new medications.  Because of worsening shortness of breath presented for further evaluation In the emergency department, the patient was afebrile hemodynamically stable with oxygen saturation 75% on room air.  BMP showed potassium 3.1 with serum creatinine 0.52.  WBC 11.0, hemoglobin 14.0, platelets 273,000.  CT angiogram chest was negative for pulmonary embolus but showed right paratracheal lymph node and subcarinal lymph node.  There is also small bilateral pleural effusions, right greater than left.  There was a right perihilar mass that increased in size since his previous CT.  There was a stable ill-defined masslike density and consolidation in the right upper lobe with increasing surrounding groundglass opacity and interlobular thickening suggesting lymphangitic spread.  There is osseous mets to there is right glenoid and left fourth and fifth ribs.  Assessment/Plan: Acute respiratory failure with hypoxia -Multifactorial including lymphangitic spread from his lung cancer, COPD exacerbation,  postobstructive pneumonia -Presently stable on 4 L nasal cannula>>>3L -Wean oxygen as tolerated for saturation greater 90%  COPD exacerbation -Start duo nebs -continue IV Solu-Medrol  Postobstructive pneumonia -Continue cefepime -Check procalcitonin 43.47 -MRSA screen--  Atrial Fibrillation, type unspecified -had short episode RVR -now rate controlled -likely pre-established before admission as pt says he's been digoxin >5 years -personally reviewed EKG--afib, nonspecific ST changes -TSH -Echo -CHADS-VASc--4 (Age, DM, ASVD) -start apixaban  Extensive stage small cell lung cancer -Patient has metastasis to the vertebrae, ribs, glenoid, and liver -Last chemotherapy 06/05/2019 -CTA chest suggest lymphangitic spread -Follow-up Dustin Shaw  Diabetes mellitus type 2 -Allow for liberal glycemic control at this point  Hypokalemia/Hypomagnesemia -Replete  Neoplastic associated pain -Continue home dose oxycodone  Goals of care discussion -Discuss goals of care with spouse and patient--> confirms DNR     Disposition Plan: Patient From: Home D/C Place: Home - 2-3  Days Barriers: Not Clinically Stable--remains of IV abx/solumedrol  Family Communication:   Spouse updated at bedside 3/14  Consultants:  none  Code Status:  DNR  DVT Prophylaxis:apixaban   Procedures: As Listed in Progress Note Above  Antibiotics: cefepime       Subjective: Pt states sob is 25% better.  Denies cp, n/v/d, abd pain.  No f/c.  Nonproductive cough  Objective: Vitals:   06/06/19 2012 06/06/19 2156 06/07/19 0537 06/07/19 0736  BP:  (!) 92/56 103/62   Pulse:  74 69   Resp:  16 18   Temp:  98.7 F (37.1 C) 98.1 F (36.7 C)   TempSrc:  Oral Oral   SpO2: 98% 96% 98% (!) 75%  Weight:      Height:  Intake/Output Summary (Last 24 hours) at 06/07/2019 1032 Last data filed at 06/07/2019 0900 Gross per 24 hour  Intake 530.2 ml  Output 1100 ml  Net -569.8 ml    Weight change:  Exam:   General:  Pt is alert, follows commands appropriately, not in acute distress  HEENT: No icterus, No thrush, No neck mass, [place]/AT  Cardiovascular: IRRR, S1/S2, no rubs, no gallops  Respiratory: bilateral rhonchi.  Bibasilar wheeze  Abdomen: Soft/+BS, non tender, non distended, no guarding  Extremities: No edema, No lymphangitis, No petechiae, No rashes, no synovitis   Data Reviewed: I have personally reviewed following labs and imaging studies Basic Metabolic Panel: Recent Labs  Lab 06/03/19 0906 06/06/19 0940 06/07/19 0540  NA 135 134* 136  K 3.4* 3.1* 4.3  CL 95* 96* 99  CO2 29 29 31   GLUCOSE 150* 114* 144*  BUN 11 16 16   CREATININE 0.59* 0.52* 0.42*  CALCIUM 9.6 9.0 8.4*  MG 1.7  --  1.5*   Liver Function Tests: Recent Labs  Lab 06/03/19 0906 06/07/19 0540  AST 23 22  ALT 12 14  ALKPHOS 105 77  BILITOT 0.6 0.7  PROT 7.3 5.7*  ALBUMIN 3.1* 2.6*   No results for input(s): LIPASE, AMYLASE in the last 168 hours. No results for input(s): AMMONIA in the last 168 hours. Coagulation Profile: No results for input(s): INR, PROTIME in the last 168 hours. CBC: Recent Labs  Lab 06/03/19 0906 06/06/19 0940 06/07/19 0540  WBC 9.5 11.0* 6.4  NEUTROABS 7.4 9.6*  --   HGB 14.5 14.0 12.0*  HCT 45.6 45.2 38.5*  MCV 90.5 92.1 90.2  PLT 433* 373 261   Cardiac Enzymes: No results for input(s): CKTOTAL, CKMB, CKMBINDEX, TROPONINI in the last 168 hours. BNP: Invalid input(s): POCBNP CBG: Recent Labs  Lab 06/06/19 1636 06/06/19 2154  GLUCAP 138* 187*   HbA1C: Recent Labs    06/06/19 1645  HGBA1C 6.0*   Urine analysis:    Component Value Date/Time   COLORURINE YELLOW 05/10/2019 1350   APPEARANCEUR CLEAR 05/10/2019 1350   LABSPEC 1.005 05/10/2019 1350   PHURINE 6.0 05/10/2019 1350   GLUCOSEU NEGATIVE 05/10/2019 1350   HGBUR NEGATIVE 05/10/2019 1350   BILIRUBINUR NEGATIVE 05/10/2019 1350   KETONESUR NEGATIVE 05/10/2019 1350    PROTEINUR NEGATIVE 05/10/2019 1350   NITRITE NEGATIVE 05/10/2019 1350   LEUKOCYTESUR NEGATIVE 05/10/2019 1350   Sepsis Labs: @LABRCNTIP (procalcitonin:4,lacticidven:4) ) Recent Results (from the past 240 hour(s))  SARS CORONAVIRUS 2 (Dustin Shaw 6-24 HRS) Nasopharyngeal Nasopharyngeal Swab     Status: None   Collection Time: 06/06/19 10:42 AM   Specimen: Nasopharyngeal Swab  Result Value Ref Range Status   SARS Coronavirus 2 NEGATIVE NEGATIVE Final    Comment: (NOTE) SARS-CoV-2 target nucleic acids are NOT DETECTED. The SARS-CoV-2 RNA is generally detectable in upper and lower respiratory specimens during the acute phase of infection. Negative results do not preclude SARS-CoV-2 infection, do not rule out co-infections with other pathogens, and should not be used as the sole basis for treatment or other patient management decisions. Negative results must be combined with clinical observations, patient history, and epidemiological information. The expected result is Negative. Fact Sheet for Patients: SugarRoll.be Fact Sheet for Healthcare Providers: https://www.woods-mathews.com/ This test is not yet approved or cleared by the [place] FDA and  has been authorized for detection and/or diagnosis of SARS-CoV-2 by FDA under an Emergency Use Authorization (EUA). This EUA will remain  in effect (meaning this test can be used)  for the duration of the COVID-19 declaration under Section 56 4(b)(1) of the Act, 21 U.S.C. section 360bbb-3(b)(1), unless the authorization is terminated or revoked sooner. Performed at [facility], [place] [address] [place],  [address]      Scheduled Meds: . digoxin  125 mcg Oral Daily  . enoxaparin (LOVENOX) injection  40 mg Subcutaneous Q24H  . feeding supplement (ENSURE ENLIVE)  237 mL Oral BID BM  . Ipratropium-Albuterol  1 puff Inhalation Q6H  . methylPREDNISolone (SOLU-MEDROL) injection  60 mg  Intravenous Q6H  . multivitamin with minerals  1 tablet Oral Daily  . tamsulosin  0.4 mg Oral QPM  . traZODone  50 mg Oral 2 times per day   Continuous Infusions: . ceFEPime (MAXIPIME) IV 2 g (06/07/19 0517)    Procedures/Studies: DG Shoulder Right  Result Date: 05/10/2019 CLINICAL DATA:  Right shoulder pain EXAM: RIGHT SHOULDER - 2+ VIEW COMPARISON:  None. FINDINGS: Degenerative changes are noted in the glenohumeral joint and acromioclavicular joint. Humeral head is somewhat high-riding likely related to chronic rotator cuff injury. No definitive fracture is seen. IMPRESSION: Degenerative change without acute abnormality. Electronically Signed   By: Inez Catalina M.D.   On: 05/10/2019 13:24   CT Angio Chest PE W and/or Wo Contrast  Result Date: 06/06/2019 CLINICAL DATA:  Dyspnea for 2 days. Recent diagnosis of small cell lung cancer, initiated chemotherapy this week. EXAM: CT ANGIOGRAPHY CHEST WITH CONTRAST TECHNIQUE: Multidetector CT imaging of the chest was performed using the standard protocol during bolus administration of intravenous contrast. Multiplanar CT image reconstructions and MIPs were obtained to evaluate the vascular anatomy. CONTRAST:  174mL OMNIPAQUE IOHEXOL 350 MG/ML SOLN COMPARISON:  05/10/2019 chest CT angiogram. FINDINGS: Cardiovascular: The study is high quality for the evaluation of pulmonary embolism. There are no filling defects in the central, lobar, segmental or subsegmental pulmonary artery branches to suggest acute pulmonary embolism. Atherosclerotic nonaneurysmal thoracic aorta. Normal caliber main pulmonary artery. Top-normal heart size. Trace pericardial effusion/thickening is unchanged. Left anterior descending coronary atherosclerosis. Mediastinum/Nodes: Heterogeneous thyroid gland without discrete thyroid nodules. Unremarkable esophagus. No axillary adenopathy. Right paratracheal adenopathy up to 2.9 cm short axis diameter (series 8/image 30), stable using similar  measurement technique. Enlarged 2.0 cm subcarinal node (series 8/image 52), stable. No left hilar adenopathy. Lungs/Pleura: No pneumothorax. Small dependent pleural effusions bilaterally, right greater than left, increased. Moderate centrilobular and paraseptal emphysema. Poorly marginated right perihilar 7.6 x 4.9 cm lung mass (series 9/image 150), increased from 6.3 x 3.9 cm. Stable ill-defined masslike consolidation throughout the dependent peripheral right upper lobe measuring up to 8.7 cm (series 10/image 63), with slightly increased surrounding patchy ground-glass opacity and interlobular septal thickening. Worsening passive atelectasis in the dependent lower lobes bilaterally, right greater than left. Upper abdomen: Hypodense ill-defined 2.8 cm right liver dome mass (series 8/image 89), increased from 2.1 cm. Cholecystectomy. Hypodense 2.3 cm left adrenal nodule, stable. Musculoskeletal: Lytic expansile metastases in the right glenoid and left anterolateral fourth and fifth ribs have mildly increased. Poorly marginated lytic osseous metastases in the T8 and T9 vertebral bodies and L1 vertebral body appears similar. Marked thoracic spondylosis. Review of the MIP images confirms the above findings. IMPRESSION: 1. No pulmonary embolism. 2. Small dependent pleural effusions bilaterally, right greater than left, increased. 3. Poorly marginated right perihilar lung mass is increased, compatible with known small cell malignancy. Stable ill-defined masslike consolidation throughout the dependent peripheral right upper lobe with slightly increased surrounding patchy ground-glass opacity and interlobular septal thickening,  suggesting worsening lymphangitic tumor. 4. Stable infiltrative bulky mediastinal lymphadenopathy. 5. Interval growth of right liver dome metastasis. 6. Mildly enlarging lytic expansile osseous metastases in the right glenoid and left anterolateral fourth and fifth ribs. Stable ill-defined lytic  osseous metastases in the T8, T9 and L1 vertebral bodies. 7. Stable indeterminate left adrenal lesion, metastasis not excluded. 8. Aortic Atherosclerosis (ICD10-I70.0) and Emphysema (ICD10-J43.9). Electronically Signed   By: Ilona Sorrel M.D.   On: 06/06/2019 12:29   CT Angio Chest PE W/Cm &/Or Wo Cm  Result Date: 05/10/2019 CLINICAL DATA:  Short of breath, generalized body pain EXAM: CT ANGIOGRAPHY CHEST WITH CONTRAST TECHNIQUE: Multidetector CT imaging of the chest was performed using the standard protocol during bolus administration of intravenous contrast. Multiplanar CT image reconstructions and MIPs were obtained to evaluate the vascular anatomy. CONTRAST:  12mL OMNIPAQUE IOHEXOL 350 MG/ML SOLN COMPARISON:  05/10/2019 FINDINGS: Cardiovascular: This is a technically adequate evaluation of the pulmonary vasculature. There are no filling defects or pulmonary emboli. There is trace pericardial effusion. The heart is not enlarged. Mild to moderate atherosclerosis of the aorta and coronary vessels. Mediastinum/Nodes: There is diffuse lymphadenopathy throughout the mediastinum and hila. Precarinal adenopathy measures up to 3.1 cm on image 43, subcarinal adenopathy measures up to 2.5 cm reference image 54, enlarged right hilar mass/adenopathy measures up to 4.1 cm reference image 47. Overall, the findings are consistent with bronchogenic right upper lobe neoplasm and mediastinal and ipsilateral hilar metastatic adenopathy. Lungs/Pleura: There is masslike consolidation within the right upper lobe, measuring 7.5 x 7.0 cm reference image 46. It is unclear whether this reflects underlying neoplasm or is postobstructive in nature, as there is abrupt cut off of the right upper lobe bronchus on image 47 adjacent to the right hilar mass described previously. PET-CT is recommended for further evaluation. Diffuse interlobular septal thickening throughout the right upper lobe is suspicious for lymphangitic spread of  disease. Bibasilar interstitial prominence likely reflects scarring and fibrosis. No effusion or pneumothorax. There is severe background emphysema. Upper Abdomen: Metastatic disease is seen within the liver. Please refer to corresponding CT abdomen report. Musculoskeletal: Lytic metastatic lesions are seen within the left anterior fourth rib, left anterolateral fifth rib and right posterior eleventh rib at the costovertebral junction. No pathologic fractures. There is relative lucency within the vertebral bodies from T8 through T12, metastatic disease not excluded. Lytic lesion within the superior aspect of the L1 vertebral body most likely reflects additional metastatic focus. Review of the MIP images confirms the above findings. IMPRESSION: 1. Masslike consolidation in the right upper lobe as well as a right hilar mass resulting in right upper lobe bronchial obstruction, consistent with bronchogenic malignancy. 2. Massive mediastinal and right hilar adenopathy consistent with nodal metastases. 3. Multifocal bony metastatic disease as above. 4. Interstitial prominence throughout the right upper lobe highly suspicious for lymphangitic spread of disease. 5. No evidence of pulmonary embolus. 6. Aortic Atherosclerosis (ICD10-I70.0) and Emphysema (ICD10-J43.9). Electronically Signed   By: Randa Ngo M.D.   On: 05/10/2019 15:49   MR BRAIN W WO CONTRAST  Result Date: 05/27/2019 CLINICAL DATA:  Metastatic lung cancer EXAM: MRI HEAD WITHOUT AND WITH CONTRAST TECHNIQUE: Multiplanar, multiecho pulse sequences of the brain and surrounding structures were obtained without and with intravenous contrast. CONTRAST:  62mL GADAVIST GADOBUTROL 1 MMOL/ML IV SOLN COMPARISON:  None. FINDINGS: Brain: No acute infarction, hemorrhage, hydrocephalus, extra-axial collection or mass lesion. Scattered foci of T2 hyperintensity are seen within the white matter of the cerebral hemispheres,  nonspecific, most likely related to chronic small  vessel ischemia. There is mild prominence of the supratentorial ventricles and cerebral sulci reflecting parenchymal volume loss, age-appropriate. No focus of abnormal contrast enhancement seen to suggest metastatic disease to the brain. Vascular: Normal flow voids. Skull and upper cervical spine: Limited views of the cervical spine shows involvement of the C5 vertebral body with retropulsion of the posterior wall with mild encroachment on the spinal cord at this level. Sinuses/Orbits: Bilateral lens surgery. Visualized paranasal sinuses are clear. Other: None. IMPRESSION: 1. No evidence of intracranial metastatic disease. 2. Mild-to-moderate chronic white matter ischemic changes. 3. C5 vertebral body metastatic involvement with retropulsion of the posterior wall with mild encroachment on the spinal cord. Electronically Signed   By: Pedro Earls M.D.   On: 05/27/2019 09:45   MR Thoracic Spine W Wo Contrast  Result Date: 05/27/2019 CLINICAL DATA:  Metastatic lung cancer. EXAM: MRI THORACIC WITHOUT AND WITH CONTRAST TECHNIQUE: Multiplanar and multiecho pulse sequences of the thoracic spine were obtained without and with intravenous contrast. CONTRAST:  71mL GADAVIST GADOBUTROL 1 MMOL/ML IV SOLN COMPARISON:  CT chest May 10, 2019 FINDINGS: MRI THORACIC SPINE FINDINGS Alignment:  Physiologic. Vertebrae: T1 hypointense, T2 hyperintense enhancing lesions are seen scattered throughout the thoracic spine, consistent with metastatic disease. The largest lesions involve the body, left pedicle and posterior elements at T1, body, right pedicle and posterior elements at T3, anterior aspect of the T9 through T11 vertebral bodies, right costovertebral junction at T11 and body and right pedicle of L1. The L1 lesion involves the posterior wall with mild retropulsion into the spinal canal causing at least mild spinal canal narrowing. This is only partially evaluated in the current study. No significant  retropulsion or involvement of the posterior wall at the thoracic level. Localizer images also show C5 and C7 vertebral body lesions. Although limited images were obtained, there appears to be at least mild encroachment on the spinal canal at the C5 level. Cord:  Normal signal and morphology. Paraspinal and other soft tissues: Mediastinal, right hilar and lung masses are better described on recent CT angiogram of the chest. Small right pleural effusion. Disc levels: T1-2: Mass lesion previously described obliterate the left neural foramen. There is small on extension into the left lateral epidural fat. No significant compression on the thecal sac. T2-3: Posterior disc protrusion. No significant spinal canal or neural foraminal stenosis. T3-4 through T12-L1: No spinal canal or neural foraminal stenosis. L1-2: Partially visualized. There appears to be a least mild spinal canal stenosis and moderate right neural foraminal narrowing. IMPRESSION: 1. Diffuse osseous metastatic disease involving the thoracic spine as above. The largest lesions are seen at T1, T3, T9, T11, and L1. There is mild retropulsion into the spinal canal at L1 only partially evaluated, causing at least mild spinal canal narrowing. 2. Mass lesion obliterates the left T1-2 neural foramen. There is small extension into the left lateral epidural fat without significant compression of the thecal sac. 3. Small right pleural effusion. 4. Mediastinal, right hilar and lung masses are better described on recent CT angiogram of the chest. 5. Limited views of the cervical spine shows C5 and C7 bone metastases with involvement of the posterior wall in at least mild retropulsion at C5. Electronically Signed   By: Pedro Earls M.D.   On: 05/27/2019 09:38   CT Abdomen Pelvis W Contrast  Result Date: 05/10/2019 CLINICAL DATA:  Generalized body pain, abnormal chest x-ray EXAM: CT ABDOMEN AND  PELVIS WITH CONTRAST TECHNIQUE: Multidetector CT  imaging of the abdomen and pelvis was performed using the standard protocol following bolus administration of intravenous contrast. CONTRAST:  179mL OMNIPAQUE IOHEXOL 350 MG/ML SOLN COMPARISON:  05/10/2019, 12/17/2018 FINDINGS: Lower chest: Please refer to findings within the CT chest report describing bronchogenic malignancy and intrathoracic metastatic disease. Hepatobiliary: There are multiple liver hypodensities consistent with metastatic disease, largest in the right lobe reference image 10 measuring 1.8 x 1.9 cm. Gallbladder surgically absent. Pancreas: Unremarkable. No pancreatic ductal dilatation or surrounding inflammatory changes. Spleen: Normal in size without focal abnormality. Adrenals/Urinary Tract: Left adrenal thickening measures 16 mm, concerning for metastatic disease given chest CT findings. Right adrenals unremarkable. The kidneys enhance normally and symmetrically. Normal excretion of contrast into unremarkable bilateral pelvocaliceal structures. Bladder is unremarkable. Stomach/Bowel: No bowel obstruction or ileus. No inflammatory changes. Vascular/Lymphatic: Aortic atherosclerosis. No enlarged abdominal or pelvic lymph nodes. Reproductive: Prostate is unremarkable. Other: No abdominal wall hernia or abnormality. No abdominopelvic ascites. Musculoskeletal: Lytic metastatic lesions are seen within the right superior aspect of the L1 vertebral body as well as the inferior aspect of the L5 vertebral body. I do not see any pathologic fracture. Reconstructed images demonstrate no additional findings. IMPRESSION: 1. Metastatic disease within the liver, left adrenal gland, and lumbar spine. Please refer to CT chest report describing primary lung cancer and intrathoracic metastases. Electronically Signed   By: Randa Ngo M.D.   On: 05/10/2019 15:53   US Abdomen Limited  Result Date: 05/27/2019 CLINICAL DATA:  86 year old male with suspected lung cancer metastatic to the liver. Assess for  visibility of small liver lesions prior to planned ultrasound-guided biopsy. EXAM: ULTRASOUND ABDOMEN LIMITED RIGHT UPPER QUADRANT COMPARISON:  None. FINDINGS: Gallbladder: Surgically absent Common bile duct: Diameter: Normal at 6 mm Liver: Three solid echogenic lesions are identified in the liver. The largest in the superior aspect of the right hemi liver measures 2.6 x 2.8 x 3.5 cm. There is a smaller adjacent satellite nodule slightly more centrally within the liver which measures 0.9 x 1.1 x 1.6 cm. More inferiorly in the right lower lobe in the medial subcapsular region is another 1.2 x 1.1 x 1.2 cm lesion. No biliary ductal dilatation. Other: None. IMPRESSION: There are at least 3 solid slightly echogenic lesions scattered throughout the right hemi liver concerning for metastatic disease. At least 1 of the lesions should be amenable to ultrasound-guided core biopsy. Electronically Signed   By: Jacqulynn Cadet M.D.   On: 05/27/2019 13:23   DG Chest Port 1 View  Result Date: 06/06/2019 CLINICAL DATA:  Short of breath. History of metastatic lung carcinoma. EXAM: PORTABLE CHEST 1 VIEW COMPARISON:  05/10/2019, chest radiograph and chest CT. FINDINGS: Dense consolidation in the right upper lobe extends from the right hilum, bordering on the minor fissure. Interstitial and hazy airspace lung opacities are noted elsewhere throughout the right lung. There are prominent interstitial markings in the left lung most evident at the base. No left lung consolidation. No convincing pleural effusion and no pneumothorax. There are destructive lesions of the left lateral fourth and fifth ribs. IMPRESSION: 1. Interval worsening. Opacity extending from the right hilum is more confluent, and there is now extensive ground-glass opacity and interstitial opacities throughout the remainder of the right lung, which could reflect superimposed pneumonia, postobstructive changes or a combination. 2. Lytic left-sided rib lesions have  become more apparent than on the prior portable chest radiograph. Electronically Signed   By: Dedra Skeens.D.  On: 06/06/2019 10:02   DG Chest Portable 1 View  Result Date: 05/10/2019 CLINICAL DATA:  Generalized body pain and dyspnea EXAM: PORTABLE CHEST 1 VIEW COMPARISON:  None. FINDINGS: Cardiac shadows within normal limits. Aortic calcifications are seen. Vascular congestion is noted as well as diffuse right upper lobe infiltrate consistent with acute pneumonia. No bony abnormality is noted. IMPRESSION: Diffuse right upper lobe infiltrate. Mild vascular congestion is noted as well. Electronically Signed   By: Inez Catalina M.D.   On: 05/10/2019 13:23   [place] CORE BIOPSY (LIVER)  Result Date: 05/27/2019 INDICATION: 86 year old male with liver lesions and a right upper lobe mass concerning for lung cancer metastatic to the liver. Presents for ultrasound-guided core biopsy to confirm tissue diagnosis. EXAM: ULTRASOUND BIOPSY CORE LIVER MEDICATIONS: None. ANESTHESIA/SEDATION: Moderate (conscious) sedation was employed during this procedure. A total of Versed 1 mg and Fentanyl 50 mcg was administered intravenously. Moderate Sedation Time: 14 minutes. The patient's level of consciousness and vital signs were monitored continuously by radiology nursing throughout the procedure under my direct supervision. FLUOROSCOPY TIME:  None). COMPLICATIONS: None immediate. PROCEDURE: Informed written consent was obtained from the patient after a thorough discussion of the procedural risks, benefits and alternatives. All questions were addressed. Maximal Sterile Barrier Technique was utilized including caps, mask, sterile gowns, sterile gloves, sterile drape, hand hygiene and skin antiseptic. A timeout was performed prior to the initiation of the procedure. The liver was interrogated with ultrasound. The 1.1 cm lesion in the subcapsular medial aspect of segment 6 was identified as the most accessible biopsy target. The  overlying skin was marked and then sterilely prepped and draped in the standard fashion using chlorhexidine skin prep. Local anesthesia was attained by infiltration with 1% lidocaine. A small dermatotomy was made. Under real-time ultrasound guidance, a 17 gauge introducer needle was advanced and positioned at the margin of the lesion. Multiple 18 gauge core biopsies were then obtained coaxially using the bio Pince automated biopsy device. Biopsy specimens were placed in formalin and delivered to pathology for further analysis. As the introducer needle was removed, the biopsy tract was embolized with a Gel-Foam slurry. Post biopsy ultrasound imaging demonstrates no evidence of bleeding or complication. IMPRESSION: Technically successful ultrasound-guided core biopsy of liver lesion. Electronically Signed   By: Jacqulynn Cadet M.D.   On: 05/27/2019 15:32    Orson Eva, DO  [facility]  If 7PM-7AM, please contact night-coverage [email or web address] Password [email or web address] 06/07/2019, 10:32 AM   LOS: 1 day

## 2019-06-08 ENCOUNTER — Telehealth (HOSPITAL_COMMUNITY): Payer: Self-pay

## 2019-06-08 ENCOUNTER — Encounter (HOSPITAL_COMMUNITY): Payer: Self-pay | Admitting: *Deleted

## 2019-06-08 ENCOUNTER — Ambulatory Visit (HOSPITAL_COMMUNITY): Payer: Medicare HMO

## 2019-06-08 ENCOUNTER — Encounter (HOSPITAL_COMMUNITY): Payer: Self-pay | Admitting: Internal Medicine

## 2019-06-08 DIAGNOSIS — Z515 Encounter for palliative care: Secondary | ICD-10-CM

## 2019-06-08 DIAGNOSIS — Z7189 Other specified counseling: Secondary | ICD-10-CM

## 2019-06-08 DIAGNOSIS — E43 Unspecified severe protein-calorie malnutrition: Secondary | ICD-10-CM | POA: Insufficient documentation

## 2019-06-08 DIAGNOSIS — R0600 Dyspnea, unspecified: Secondary | ICD-10-CM

## 2019-06-08 LAB — BASIC METABOLIC PANEL
Anion gap: 9 (ref 5–15)
BUN: 17 mg/dL (ref 8–23)
CO2: 31 mmol/L (ref 22–32)
Calcium: 8.9 mg/dL (ref 8.9–10.3)
Chloride: 96 mmol/L — ABNORMAL LOW (ref 98–111)
Creatinine, Ser: 0.44 mg/dL — ABNORMAL LOW (ref 0.61–1.24)
GFR calc Af Amer: >60 mL/min (ref >60)
GFR calc non Af Amer: >60 mL/min (ref >60)
Glucose, Bld: 173 mg/dL — ABNORMAL HIGH (ref 70–99)
Potassium: 4.1 mmol/L (ref 3.5–5.1)
Sodium: 136 mmol/L (ref 135–145)

## 2019-06-08 LAB — MAGNESIUM: Magnesium: 1.7 mg/dL (ref 1.7–2.4)

## 2019-06-08 MED ORDER — BOOST / RESOURCE BREEZE PO LIQD CUSTOM
1.0000 | Freq: Three times a day (TID) | ORAL | Status: DC
  Administered 2019-06-08 – 2019-06-09 (×2): 1 via ORAL

## 2019-06-08 MED ORDER — BUDESONIDE 0.5 MG/2ML IN SUSP
0.5000 mg | Freq: Two times a day (BID) | RESPIRATORY_TRACT | Status: DC
  Administered 2019-06-08 – 2019-06-09 (×2): 0.5 mg via RESPIRATORY_TRACT
  Filled 2019-06-08 (×2): qty 2

## 2019-06-08 MED ORDER — MAGNESIUM SULFATE 2 GM/50ML IV SOLN
2.0000 g | Freq: Once | INTRAVENOUS | Status: AC
  Administered 2019-06-08: 2 g via INTRAVENOUS
  Filled 2019-06-08: qty 50

## 2019-06-08 MED ORDER — IPRATROPIUM-ALBUTEROL 0.5-2.5 (3) MG/3ML IN SOLN
3.0000 mL | Freq: Four times a day (QID) | RESPIRATORY_TRACT | Status: DC
  Administered 2019-06-08 – 2019-06-09 (×5): 3 mL via RESPIRATORY_TRACT
  Filled 2019-06-08 (×4): qty 3

## 2019-06-08 NOTE — Progress Notes (Signed)
PROGRESS NOTE  Dustin Shaw MOQ:[id number] DOB: 01/16/1933 DOA: 06/06/2019 PCP: Wannetta Sender, FNP  Brief History:  86 y.o.malewith medical history ofextensive stage small cell lung cancer, diabetes mellitus type 2, and BPH presenting with 2-day history of shortness of breath. Patient was recently diagnosed with small cell lung cancer in the first week of March after liver biopsy. He was started on atezolizumab, carboplatin, and etoposidewith his last treatment on 06/05/2019. He denied any fevers, chills, chest pain, nausea, vomiting, diarrhea, abdominal pain, dysuria, hematuria, headache. He has a nonproductive cough. He denies any hemoptysis. He denies any hematochezia or melena. He denies any worsening lower extremity edema orthopnea type symptoms. He is not normally on home oxygen. He quit smoking in 1991 after approximately 80-pack-year history. He denies any new medications. Because of worsening shortness of breath presented for further evaluation In the emergency department, the patient was afebrile hemodynamically stable with oxygen saturation 75% on room air. BMP showed potassium 3.1 with serum creatinine 0.52. WBC 11.0, hemoglobin 14.0, platelets 273,000. CT angiogram chest was negative for pulmonary embolus but showed right paratracheal lymph node and subcarinal lymph node. There is also small bilateral pleural effusions, right greater than left. There was a right perihilar mass that increased in size since his previous CT. There was a stable ill-defined masslike density and consolidation in the right upper lobe with increasing surrounding groundglass opacity and interlobular thickening suggesting lymphangitic spread. There is osseous mets to there is right glenoid and left fourth and fifth ribs.  Assessment/Plan: Acute respiratory failure with hypoxia -Multifactorial including lymphangitic spread from his lung cancer, COPD exacerbation,  postobstructive pneumonia -Presently stable on 4 L nasal cannula>>>3L -Wean oxygen as tolerated for saturation greater 90% -ambulatory pulse ox desaturated < 88%-->set up home oxygen  COPD exacerbation -continue duo nebs -continue IV Solu-Medrol  Postobstructive pneumonia -Continue cefepime -Check procalcitonin 43.47 -MRSA screen--neg  Atrial Fibrillation, type unspecified -had short episode RVR -now rate controlled -likely pre-established before admission as pt says he's been digoxin >5 years -personally reviewed EKG--afib, nonspecific ST changes -TSH--0.445 -Echo -CHADS-VASc--4 (Age, DM, ASVD) -started apixaban  Extensive stage small cell lung cancer -Patient has metastasis to the vertebrae, ribs, glenoid, and liver -Last chemotherapy 06/05/2019 -CTA chest suggest lymphangitic spread -Follow-up Dr. Delton Coombes  Diabetes mellitus type 2 -Allow for liberal glycemic control at this point  Hypokalemia/Hypomagnesemia -Repleted  Neoplastic associated pain -Continue home dose oxycodone  Goals of care discussion -Discuss goals of care with spouse and patient-->confirms DNR     Disposition Plan: Patient From: Home D/C Place: Home - 2-3  Days Barriers: Not Clinically Stable--remains of IV abx/solumedrol  Family Communication:   Spouse updated at bedside 3/14  Consultants:  none  Code Status:  DNR  DVT Prophylaxis:apixaban   Procedures: As Listed in Progress Note Above  Antibiotics: cefepime      Disposition Plan: Patient From: Home D/C Place: Home 06/09/19 Barriers: Not Clinically Stable--TOC arrange oxygen, DME  Family Communication:   Spouse update at bedside 3/15  Consultants:  Palliative medicine  Code Status:   DNR  DVT Prophylaxis:  apixaban   Procedures: As Listed in Progress Note Above  Antibiotics: Cefepime 3/13>>>       Subjective: Pt is feeling better.  He is breathing better and feeling stronger.   Patient denies fevers, chills, headache, chest pain, nausea, vomiting, diarrhea, abdominal pain, dysuria, hematuria, hematochezia, and melena.   Objective: Vitals:   06/08/19 0456 06/08/19  0757 06/08/19 1355 06/08/19 1441  BP: (!) 101/57  (!) 110/53   Pulse: 77  91   Resp: 17  16   Temp: 97.7 F (36.5 C)  97.6 F (36.4 C)   TempSrc: Oral  Oral   SpO2: 97% 96% 95% 96%  Weight:      Height:        Intake/Output Summary (Last 24 hours) at 06/08/2019 1725 Last data filed at 06/08/2019 1723 Gross per 24 hour  Intake 510.92 ml  Output 1050 ml  Net -539.08 ml   Weight change:  Exam:   General:  Pt is alert, follows commands appropriately, not in acute distress  HEENT: No icterus, No thrush, No neck mass, [place]/AT  Cardiovascular: RRR, S1/S2, no rubs, no gallops  Respiratory: diminished BS.  Bibasilar crackles. No wheeze  Abdomen: Soft/+BS, non tender, non distended, no guarding  Extremities: No edema, No lymphangitis, No petechiae, No rashes, no synovitis   Data Reviewed: I have personally reviewed following labs and imaging studies Basic Metabolic Panel: Recent Labs  Lab 06/03/19 0906 06/06/19 0940 06/07/19 0540 06/08/19 0523  NA 135 134* 136 136  K 3.4* 3.1* 4.3 4.1  CL 95* 96* 99 96*  CO2 29 29 31 31   GLUCOSE 150* 114* 144* 173*  BUN 11 16 16 17   CREATININE 0.59* 0.52* 0.42* 0.44*  CALCIUM 9.6 9.0 8.4* 8.9  MG 1.7  --  1.5* 1.7   Liver Function Tests: Recent Labs  Lab 06/03/19 0906 06/07/19 0540  AST 23 22  ALT 12 14  ALKPHOS 105 77  BILITOT 0.6 0.7  PROT 7.3 5.7*  ALBUMIN 3.1* 2.6*   No results for input(s): LIPASE, AMYLASE in the last 168 hours. No results for input(s): AMMONIA in the last 168 hours. Coagulation Profile: No results for input(s): INR, PROTIME in the last 168 hours. CBC: Recent Labs  Lab 06/03/19 0906 06/06/19 0940 06/07/19 0540  WBC 9.5 11.0* 6.4  NEUTROABS 7.4 9.6*  --   HGB 14.5 14.0 12.0*  HCT 45.6 45.2 38.5*  MCV 90.5  92.1 90.2  PLT 433* 373 261   Cardiac Enzymes: No results for input(s): CKTOTAL, CKMB, CKMBINDEX, TROPONINI in the last 168 hours. BNP: Invalid input(s): POCBNP CBG: Recent Labs  Lab 06/06/19 1636 06/06/19 2154  GLUCAP 138* 187*   HbA1C: Recent Labs    06/06/19 1645  HGBA1C 6.0*   Urine analysis:    Component Value Date/Time   COLORURINE YELLOW 05/10/2019 1350   APPEARANCEUR CLEAR 05/10/2019 1350   LABSPEC 1.005 05/10/2019 1350   PHURINE 6.0 05/10/2019 1350   GLUCOSEU NEGATIVE 05/10/2019 1350   HGBUR NEGATIVE 05/10/2019 1350   BILIRUBINUR NEGATIVE 05/10/2019 1350   KETONESUR NEGATIVE 05/10/2019 1350   PROTEINUR NEGATIVE 05/10/2019 1350   NITRITE NEGATIVE 05/10/2019 1350   LEUKOCYTESUR NEGATIVE 05/10/2019 1350   Sepsis Labs: @LABRCNTIP (procalcitonin:4,lacticidven:4) ) Recent Results (from the past 240 hour(s))  SARS CORONAVIRUS 2 (Kirill Chatterjee 6-24 HRS) Nasopharyngeal Nasopharyngeal Swab     Status: None   Collection Time: 06/06/19 10:42 AM   Specimen: Nasopharyngeal Swab  Result Value Ref Range Status   SARS Coronavirus 2 NEGATIVE NEGATIVE Final    Comment: (NOTE) SARS-CoV-2 target nucleic acids are NOT DETECTED. The SARS-CoV-2 RNA is generally detectable in upper and lower respiratory specimens during the acute phase of infection. Negative results do not preclude SARS-CoV-2 infection, do not rule out co-infections with other pathogens, and should not be used as the sole basis for treatment or other patient  management decisions. Negative results must be combined with clinical observations, patient history, and epidemiological information. The expected result is Negative. Fact Sheet for Patients: SugarRoll.be Fact Sheet for Healthcare Providers: https://www.woods-mathews.com/ This test is not yet approved or cleared by the [place] FDA and  has been authorized for detection and/or diagnosis of SARS-CoV-2 by FDA under an  Emergency Use Authorization (EUA). This EUA will remain  in effect (meaning this test can be used) for the duration of the COVID-19 declaration under Section 56 4(b)(1) of the Act, 21 U.S.C. section 360bbb-3(b)(1), unless the authorization is terminated or revoked sooner. Performed at [facility], [place] [address], [place], [place] [address]   MRSA PCR Screening     Status: None   Collection Time: 06/06/19  4:16 PM   Specimen: Nasal Mucosa; Nasopharyngeal  Result Value Ref Range Status   MRSA by PCR NEGATIVE NEGATIVE Final    Comment:        The GeneXpert MRSA Assay (FDA approved for NASAL specimens only), is one component of a comprehensive MRSA colonization surveillance program. It is not intended to diagnose MRSA infection nor to guide or monitor treatment for MRSA infections. Performed at [facility], [address], [place], [place] [address]      Scheduled Meds: . apixaban  5 mg Oral BID  . budesonide (PULMICORT) nebulizer solution  0.5 mg Nebulization BID  . digoxin  125 mcg Oral Daily  . feeding supplement  1 Container Oral TID BM  . ipratropium-albuterol  3 mL Nebulization Q6H WA  . methylPREDNISolone (SOLU-MEDROL) injection  60 mg Intravenous Q6H  . multivitamin with minerals  1 tablet Oral Daily  . tamsulosin  0.4 mg Oral QPM  . traZODone  50 mg Oral 2 times per day   Continuous Infusions: . ceFEPime (MAXIPIME) IV 2 g (06/08/19 1711)    Procedures/Studies: DG Shoulder Right  Result Date: 05/10/2019 CLINICAL DATA:  Right shoulder pain EXAM: RIGHT SHOULDER - 2+ VIEW COMPARISON:  None. FINDINGS: Degenerative changes are noted in the glenohumeral joint and acromioclavicular joint. Humeral head is somewhat high-riding likely related to chronic rotator cuff injury. No definitive fracture is seen. IMPRESSION: Degenerative change without acute abnormality. Electronically Signed   By: Inez Catalina M.D.   On: 05/10/2019 13:24   CT Angio Chest PE W and/or Wo  Contrast  Result Date: 06/06/2019 CLINICAL DATA:  Dyspnea for 2 days. Recent diagnosis of small cell lung cancer, initiated chemotherapy this week. EXAM: CT ANGIOGRAPHY CHEST WITH CONTRAST TECHNIQUE: Multidetector CT imaging of the chest was performed using the standard protocol during bolus administration of intravenous contrast. Multiplanar CT image reconstructions and MIPs were obtained to evaluate the vascular anatomy. CONTRAST:  134mL OMNIPAQUE IOHEXOL 350 MG/ML SOLN COMPARISON:  05/10/2019 chest CT angiogram. FINDINGS: Cardiovascular: The study is high quality for the evaluation of pulmonary embolism. There are no filling defects in the central, lobar, segmental or subsegmental pulmonary artery branches to suggest acute pulmonary embolism. Atherosclerotic nonaneurysmal thoracic aorta. Normal caliber main pulmonary artery. Top-normal heart size. Trace pericardial effusion/thickening is unchanged. Left anterior descending coronary atherosclerosis. Mediastinum/Nodes: Heterogeneous thyroid gland without discrete thyroid nodules. Unremarkable esophagus. No axillary adenopathy. Right paratracheal adenopathy up to 2.9 cm short axis diameter (series 8/image 30), stable using similar measurement technique. Enlarged 2.0 cm subcarinal node (series 8/image 52), stable. No left hilar adenopathy. Lungs/Pleura: No pneumothorax. Small dependent pleural effusions bilaterally, right greater than left, increased. Moderate centrilobular and paraseptal emphysema. Poorly marginated right perihilar 7.6 x  4.9 cm lung mass (series 9/image 150), increased from 6.3 x 3.9 cm. Stable ill-defined masslike consolidation throughout the dependent peripheral right upper lobe measuring up to 8.7 cm (series 10/image 63), with slightly increased surrounding patchy ground-glass opacity and interlobular septal thickening. Worsening passive atelectasis in the dependent lower lobes bilaterally, right greater than left. Upper abdomen: Hypodense  ill-defined 2.8 cm right liver dome mass (series 8/image 89), increased from 2.1 cm. Cholecystectomy. Hypodense 2.3 cm left adrenal nodule, stable. Musculoskeletal: Lytic expansile metastases in the right glenoid and left anterolateral fourth and fifth ribs have mildly increased. Poorly marginated lytic osseous metastases in the T8 and T9 vertebral bodies and L1 vertebral body appears similar. Marked thoracic spondylosis. Review of the MIP images confirms the above findings. IMPRESSION: 1. No pulmonary embolism. 2. Small dependent pleural effusions bilaterally, right greater than left, increased. 3. Poorly marginated right perihilar lung mass is increased, compatible with known small cell malignancy. Stable ill-defined masslike consolidation throughout the dependent peripheral right upper lobe with slightly increased surrounding patchy ground-glass opacity and interlobular septal thickening, suggesting worsening lymphangitic tumor. 4. Stable infiltrative bulky mediastinal lymphadenopathy. 5. Interval growth of right liver dome metastasis. 6. Mildly enlarging lytic expansile osseous metastases in the right glenoid and left anterolateral fourth and fifth ribs. Stable ill-defined lytic osseous metastases in the T8, T9 and L1 vertebral bodies. 7. Stable indeterminate left adrenal lesion, metastasis not excluded. 8. Aortic Atherosclerosis (ICD10-I70.0) and Emphysema (ICD10-J43.9). Electronically Signed   By: Ilona Sorrel M.D.   On: 06/06/2019 12:29   CT Angio Chest PE W/Cm &/Or Wo Cm  Result Date: 05/10/2019 CLINICAL DATA:  Short of breath, generalized body pain EXAM: CT ANGIOGRAPHY CHEST WITH CONTRAST TECHNIQUE: Multidetector CT imaging of the chest was performed using the standard protocol during bolus administration of intravenous contrast. Multiplanar CT image reconstructions and MIPs were obtained to evaluate the vascular anatomy. CONTRAST:  136mL OMNIPAQUE IOHEXOL 350 MG/ML SOLN COMPARISON:  05/10/2019  FINDINGS: Cardiovascular: This is a technically adequate evaluation of the pulmonary vasculature. There are no filling defects or pulmonary emboli. There is trace pericardial effusion. The heart is not enlarged. Mild to moderate atherosclerosis of the aorta and coronary vessels. Mediastinum/Nodes: There is diffuse lymphadenopathy throughout the mediastinum and hila. Precarinal adenopathy measures up to 3.1 cm on image 43, subcarinal adenopathy measures up to 2.5 cm reference image 54, enlarged right hilar mass/adenopathy measures up to 4.1 cm reference image 47. Overall, the findings are consistent with bronchogenic right upper lobe neoplasm and mediastinal and ipsilateral hilar metastatic adenopathy. Lungs/Pleura: There is masslike consolidation within the right upper lobe, measuring 7.5 x 7.0 cm reference image 46. It is unclear whether this reflects underlying neoplasm or is postobstructive in nature, as there is abrupt cut off of the right upper lobe bronchus on image 47 adjacent to the right hilar mass described previously. PET-CT is recommended for further evaluation. Diffuse interlobular septal thickening throughout the right upper lobe is suspicious for lymphangitic spread of disease. Bibasilar interstitial prominence likely reflects scarring and fibrosis. No effusion or pneumothorax. There is severe background emphysema. Upper Abdomen: Metastatic disease is seen within the liver. Please refer to corresponding CT abdomen report. Musculoskeletal: Lytic metastatic lesions are seen within the left anterior fourth rib, left anterolateral fifth rib and right posterior eleventh rib at the costovertebral junction. No pathologic fractures. There is relative lucency within the vertebral bodies from T8 through T12, metastatic disease not excluded. Lytic lesion within the superior aspect of the L1 vertebral body most  likely reflects additional metastatic focus. Review of the MIP images confirms the above findings.  IMPRESSION: 1. Masslike consolidation in the right upper lobe as well as a right hilar mass resulting in right upper lobe bronchial obstruction, consistent with bronchogenic malignancy. 2. Massive mediastinal and right hilar adenopathy consistent with nodal metastases. 3. Multifocal bony metastatic disease as above. 4. Interstitial prominence throughout the right upper lobe highly suspicious for lymphangitic spread of disease. 5. No evidence of pulmonary embolus. 6. Aortic Atherosclerosis (ICD10-I70.0) and Emphysema (ICD10-J43.9). Electronically Signed   By: Randa Ngo M.D.   On: 05/10/2019 15:49   MR BRAIN W WO CONTRAST  Result Date: 05/27/2019 CLINICAL DATA:  Metastatic lung cancer EXAM: MRI HEAD WITHOUT AND WITH CONTRAST TECHNIQUE: Multiplanar, multiecho pulse sequences of the brain and surrounding structures were obtained without and with intravenous contrast. CONTRAST:  6mL GADAVIST GADOBUTROL 1 MMOL/ML IV SOLN COMPARISON:  None. FINDINGS: Brain: No acute infarction, hemorrhage, hydrocephalus, extra-axial collection or mass lesion. Scattered foci of T2 hyperintensity are seen within the white matter of the cerebral hemispheres, nonspecific, most likely related to chronic small vessel ischemia. There is mild prominence of the supratentorial ventricles and cerebral sulci reflecting parenchymal volume loss, age-appropriate. No focus of abnormal contrast enhancement seen to suggest metastatic disease to the brain. Vascular: Normal flow voids. Skull and upper cervical spine: Limited views of the cervical spine shows involvement of the C5 vertebral body with retropulsion of the posterior wall with mild encroachment on the spinal cord at this level. Sinuses/Orbits: Bilateral lens surgery. Visualized paranasal sinuses are clear. Other: None. IMPRESSION: 1. No evidence of intracranial metastatic disease. 2. Mild-to-moderate chronic white matter ischemic changes. 3. C5 vertebral body metastatic involvement with  retropulsion of the posterior wall with mild encroachment on the spinal cord. Electronically Signed   By: Pedro Earls M.D.   On: 05/27/2019 09:45   MR Thoracic Spine W Wo Contrast  Result Date: 05/27/2019 CLINICAL DATA:  Metastatic lung cancer. EXAM: MRI THORACIC WITHOUT AND WITH CONTRAST TECHNIQUE: Multiplanar and multiecho pulse sequences of the thoracic spine were obtained without and with intravenous contrast. CONTRAST:  31mL GADAVIST GADOBUTROL 1 MMOL/ML IV SOLN COMPARISON:  CT chest May 10, 2019 FINDINGS: MRI THORACIC SPINE FINDINGS Alignment:  Physiologic. Vertebrae: T1 hypointense, T2 hyperintense enhancing lesions are seen scattered throughout the thoracic spine, consistent with metastatic disease. The largest lesions involve the body, left pedicle and posterior elements at T1, body, right pedicle and posterior elements at T3, anterior aspect of the T9 through T11 vertebral bodies, right costovertebral junction at T11 and body and right pedicle of L1. The L1 lesion involves the posterior wall with mild retropulsion into the spinal canal causing at least mild spinal canal narrowing. This is only partially evaluated in the current study. No significant retropulsion or involvement of the posterior wall at the thoracic level. Localizer images also show C5 and C7 vertebral body lesions. Although limited images were obtained, there appears to be at least mild encroachment on the spinal canal at the C5 level. Cord:  Normal signal and morphology. Paraspinal and other soft tissues: Mediastinal, right hilar and lung masses are better described on recent CT angiogram of the chest. Small right pleural effusion. Disc levels: T1-2: Mass lesion previously described obliterate the left neural foramen. There is small on extension into the left lateral epidural fat. No significant compression on the thecal sac. T2-3: Posterior disc protrusion. No significant spinal canal or neural foraminal stenosis.  T3-4 through T12-L1: No  spinal canal or neural foraminal stenosis. L1-2: Partially visualized. There appears to be a least mild spinal canal stenosis and moderate right neural foraminal narrowing. IMPRESSION: 1. Diffuse osseous metastatic disease involving the thoracic spine as above. The largest lesions are seen at T1, T3, T9, T11, and L1. There is mild retropulsion into the spinal canal at L1 only partially evaluated, causing at least mild spinal canal narrowing. 2. Mass lesion obliterates the left T1-2 neural foramen. There is small extension into the left lateral epidural fat without significant compression of the thecal sac. 3. Small right pleural effusion. 4. Mediastinal, right hilar and lung masses are better described on recent CT angiogram of the chest. 5. Limited views of the cervical spine shows C5 and C7 bone metastases with involvement of the posterior wall in at least mild retropulsion at C5. Electronically Signed   By: Pedro Earls M.D.   On: 05/27/2019 09:38   CT Abdomen Pelvis W Contrast  Result Date: 05/10/2019 CLINICAL DATA:  Generalized body pain, abnormal chest x-ray EXAM: CT ABDOMEN AND PELVIS WITH CONTRAST TECHNIQUE: Multidetector CT imaging of the abdomen and pelvis was performed using the standard protocol following bolus administration of intravenous contrast. CONTRAST:  160mL OMNIPAQUE IOHEXOL 350 MG/ML SOLN COMPARISON:  05/10/2019, 12/17/2018 FINDINGS: Lower chest: Please refer to findings within the CT chest report describing bronchogenic malignancy and intrathoracic metastatic disease. Hepatobiliary: There are multiple liver hypodensities consistent with metastatic disease, largest in the right lobe reference image 10 measuring 1.8 x 1.9 cm. Gallbladder surgically absent. Pancreas: Unremarkable. No pancreatic ductal dilatation or surrounding inflammatory changes. Spleen: Normal in size without focal abnormality. Adrenals/Urinary Tract: Left adrenal thickening  measures 16 mm, concerning for metastatic disease given chest CT findings. Right adrenals unremarkable. The kidneys enhance normally and symmetrically. Normal excretion of contrast into unremarkable bilateral pelvocaliceal structures. Bladder is unremarkable. Stomach/Bowel: No bowel obstruction or ileus. No inflammatory changes. Vascular/Lymphatic: Aortic atherosclerosis. No enlarged abdominal or pelvic lymph nodes. Reproductive: Prostate is unremarkable. Other: No abdominal wall hernia or abnormality. No abdominopelvic ascites. Musculoskeletal: Lytic metastatic lesions are seen within the right superior aspect of the L1 vertebral body as well as the inferior aspect of the L5 vertebral body. I do not see any pathologic fracture. Reconstructed images demonstrate no additional findings. IMPRESSION: 1. Metastatic disease within the liver, left adrenal gland, and lumbar spine. Please refer to CT chest report describing primary lung cancer and intrathoracic metastases. Electronically Signed   By: Randa Ngo M.D.   On: 05/10/2019 15:53   US Abdomen Limited  Result Date: 05/27/2019 CLINICAL DATA:  86 year old male with suspected lung cancer metastatic to the liver. Assess for visibility of small liver lesions prior to planned ultrasound-guided biopsy. EXAM: ULTRASOUND ABDOMEN LIMITED RIGHT UPPER QUADRANT COMPARISON:  None. FINDINGS: Gallbladder: Surgically absent Common bile duct: Diameter: Normal at 6 mm Liver: Three solid echogenic lesions are identified in the liver. The largest in the superior aspect of the right hemi liver measures 2.6 x 2.8 x 3.5 cm. There is a smaller adjacent satellite nodule slightly more centrally within the liver which measures 0.9 x 1.1 x 1.6 cm. More inferiorly in the right lower lobe in the medial subcapsular region is another 1.2 x 1.1 x 1.2 cm lesion. No biliary ductal dilatation. Other: None. IMPRESSION: There are at least 3 solid slightly echogenic lesions scattered throughout the  right hemi liver concerning for metastatic disease. At least 1 of the lesions should be amenable to ultrasound-guided core biopsy. Electronically Signed  By: Jacqulynn Cadet M.D.   On: 05/27/2019 13:23   DG Chest Port 1 View  Result Date: 06/06/2019 CLINICAL DATA:  Short of breath. History of metastatic lung carcinoma. EXAM: PORTABLE CHEST 1 VIEW COMPARISON:  05/10/2019, chest radiograph and chest CT. FINDINGS: Dense consolidation in the right upper lobe extends from the right hilum, bordering on the minor fissure. Interstitial and hazy airspace lung opacities are noted elsewhere throughout the right lung. There are prominent interstitial markings in the left lung most evident at the base. No left lung consolidation. No convincing pleural effusion and no pneumothorax. There are destructive lesions of the left lateral fourth and fifth ribs. IMPRESSION: 1. Interval worsening. Opacity extending from the right hilum is more confluent, and there is now extensive ground-glass opacity and interstitial opacities throughout the remainder of the right lung, which could reflect superimposed pneumonia, postobstructive changes or a combination. 2. Lytic left-sided rib lesions have become more apparent than on the prior portable chest radiograph. Electronically Signed   By: Lajean Manes M.D.   On: 06/06/2019 10:02   DG Chest Portable 1 View  Result Date: 05/10/2019 CLINICAL DATA:  Generalized body pain and dyspnea EXAM: PORTABLE CHEST 1 VIEW COMPARISON:  None. FINDINGS: Cardiac shadows within normal limits. Aortic calcifications are seen. Vascular congestion is noted as well as diffuse right upper lobe infiltrate consistent with acute pneumonia. No bony abnormality is noted. IMPRESSION: Diffuse right upper lobe infiltrate. Mild vascular congestion is noted as well. Electronically Signed   By: Inez Catalina M.D.   On: 05/10/2019 13:23   [place] CORE BIOPSY (LIVER)  Result Date: 05/27/2019 INDICATION: 86 year old male with  liver lesions and a right upper lobe mass concerning for lung cancer metastatic to the liver. Presents for ultrasound-guided core biopsy to confirm tissue diagnosis. EXAM: ULTRASOUND BIOPSY CORE LIVER MEDICATIONS: None. ANESTHESIA/SEDATION: Moderate (conscious) sedation was employed during this procedure. A total of Versed 1 mg and Fentanyl 50 mcg was administered intravenously. Moderate Sedation Time: 14 minutes. The patient's level of consciousness and vital signs were monitored continuously by radiology nursing throughout the procedure under my direct supervision. FLUOROSCOPY TIME:  None). COMPLICATIONS: None immediate. PROCEDURE: Informed written consent was obtained from the patient after a thorough discussion of the procedural risks, benefits and alternatives. All questions were addressed. Maximal Sterile Barrier Technique was utilized including caps, mask, sterile gowns, sterile gloves, sterile drape, hand hygiene and skin antiseptic. A timeout was performed prior to the initiation of the procedure. The liver was interrogated with ultrasound. The 1.1 cm lesion in the subcapsular medial aspect of segment 6 was identified as the most accessible biopsy target. The overlying skin was marked and then sterilely prepped and draped in the standard fashion using chlorhexidine skin prep. Local anesthesia was attained by infiltration with 1% lidocaine. A small dermatotomy was made. Under real-time ultrasound guidance, a 17 gauge introducer needle was advanced and positioned at the margin of the lesion. Multiple 18 gauge core biopsies were then obtained coaxially using the bio Pince automated biopsy device. Biopsy specimens were placed in formalin and delivered to pathology for further analysis. As the introducer needle was removed, the biopsy tract was embolized with a Gel-Foam slurry. Post biopsy ultrasound imaging demonstrates no evidence of bleeding or complication. IMPRESSION: Technically successful  ultrasound-guided core biopsy of liver lesion. Electronically Signed   By: Jacqulynn Cadet M.D.   On: 05/27/2019 15:32    Orson Eva, DO  [facility]  If 7PM-7AM, please contact night-coverage [email or web address] Password [email or web address]  06/08/2019, 5:25 PM   LOS: 2 days

## 2019-06-08 NOTE — Care Management Important Message (Signed)
Important Message  Patient Details  Name: Dustin Shaw MRN: [id number] Date of Birth: 12/18/1932   Medicare Important Message Given:  Yes     Tommy Medal 06/08/2019, 3:23 PM

## 2019-06-08 NOTE — Consult Note (Signed)
Consultation Note Date: 06/08/2019   Patient Name: Dustin Shaw  DOB: [birth year]  MRN: [id number]  Age / Sex: 86 y.o., male  PCP: Wannetta Sender, FNP Referring Physician: Orson Eva, MD  Reason for Consultation: Establishing goals of care and Psychosocial/spiritual support  HPI/Patient Profile: 86 y.o. male  with past medical history of extensive small cell lung cancer (diagnosed in the first week of March) -now metastatic burden to the bone/ribs, COPD, arthritis, diabetes, BPH, admitted on 06/06/2019 with acute respiratory failure with hypoxia E multifactorial including lymphangitic spread from his lung cancer, COPD exacerbation, postobstructive pneumonia.   Clinical Assessment and Goals of Care:   I have reviewed medical records including EPIC notes, labs and imaging, received report from attending, examined the patient and met at bedside with wife of 60 years, Earlie Server, to discuss diagnosis prognosis, GOC, EOL wishes, disposition and options.  I introduced Palliative Medicine as specialized medical care for people living with serious illness. It focuses on providing relief from the symptoms and stress of a serious illness.   Mr. Chopin is lying quietly in bed.  He greets me making and somewhat keeping eye contact.  He appears acutely/chronically ill and frail.  He is alert and oriented x3, able to make his needs known.  His wife of 38 years, Dot, is at bedside.  We discussed a brief life review of the patient.  Mr. Covelli has been married to Earlie Server for 54 years.  He has worked as a Curator, Probation officer, Clinical biochemist.  As far as functional and nutritional status, Mr. Plucinski states that he started losing weight about 3 years ago.  His normal weight is 190 pounds, he is down to 130 pounds now.  We talked about nutritional supplementation.  We discussed current illness and what it  means in the larger context of on-going co-morbidities.  We talked about postobstructive pneumonia, causes and treatments and recurrence.  Natural disease trajectory and expectations at EOL were discussed.  I encourage Mr. and Mrs. Slates to consider what he wants this time to look like and feel like, where he wants to put energy.  At this point Mr. Worrell is awaiting input from oncology about cancer treatment options.  Advanced directives, concepts specific to code status, were considered and discussed.  Mr. Vivero has elected DNR, Mrs. Dittus states that she will accept his wishes  Hospice and Palliative Care services outpatient were explained and offered.  Family states that they have known others who had in-home hospice care.  I share that Mr. Vineyard is not eligible for free at home hospice care if he is receiving chemotherapy.    Questions and concerns were addressed.  The family was encouraged to call with questions or concerns.   PMT to follow.   HCPOA    NEXT OF KIN  -Mr. Birdsell names his wife for 83 years, Isaack Preble, as his Ambulance person.  They share one son, Earlie Server has 2 daughters, and Mr. Furio had 3 sons from a previous marriage (1 died of  MI last year)  SUMMARY OF RECOMMENDATIONS   Awaiting oncology input Treat the treatable but no extraordinary measures/life support  Code Status/Advance Care Planning:  DNR -verified with patient and wife at bedside.  Symptom Management:   Per hospitalist, no additional needs at this time.  Palliative Prophylaxis:   Frequent Pain Assessment  Additional Recommendations (Limitations, Scope, Preferences):  Treat the treatable but no CPR or intubation  Psycho-social/Spiritual:   Desire for further Chaplaincy support:no  Additional Recommendations: Caregiving  Support/Resources and Education on Hospice  Prognosis:   Unable to determine.  3 months or less would not be surprising based on advancing cancer with  metastatic burden to bone, decreasing functional status, frailty.  Discharge Planning: Home, unsure of home health needs      Primary Diagnoses: Present on Admission: . Acute respiratory failure with hypoxia ([place]) . Small cell lung cancer ([place]) . Metastatic lung cancer (metastasis from lung to other site), right [facility])   I have reviewed the medical record, interviewed the patient and family, and examined the patient. The following aspects are pertinent.  Past Medical History:  Diagnosis Date  . Arthritis   . BPH (benign prostatic hyperplasia)   . Cancer ([place])   . Diabetes mellitus without complication [facility])    Social History   Socioeconomic History  . Marital status: Married    Spouse name: Not on file  . Number of children: Not on file  . Years of education: Not on file  . Highest education level: Not on file  Occupational History  . Not on file  Tobacco Use  . Smoking status: Former Research scientist (life sciences)  . Smokeless tobacco: Current User    Types: Chew  Substance and Sexual Activity  . Alcohol use: Never  . Drug use: Never  . Sexual activity: Not on file  Other Topics Concern  . Not on file  Social History Narrative  . Not on file   Social Determinants of Health   Financial Resource Strain: Low Risk   . Difficulty of Paying Living Expenses: Not very hard  Food Insecurity: No Food Insecurity  . Worried About Charity fundraiser in the Last Year: Never true  . Ran Out of Food in the Last Year: Never true  Transportation Needs: No Transportation Needs  . Lack of Transportation (Medical): No  . Lack of Transportation (Non-Medical): No  Physical Activity: Inactive  . Days of Exercise per Week: 0 days  . Minutes of Exercise per Session: 0 min  Stress: No Stress Concern Present  . Feeling of Stress : Not at all  Social Connections: Slightly Isolated  . Frequency of Communication with Friends and Family: More than three times a week  . Frequency of Social Gatherings with  Friends and Family: More than three times a week  . Attends Religious Services: More than 4 times per year  . Active Member of Clubs or Organizations: No  . Attends Archivist Meetings: Never  . Marital Status: Married   History reviewed. No pertinent family history. Scheduled Meds: . apixaban  5 mg Oral BID  . budesonide (PULMICORT) nebulizer solution  0.5 mg Nebulization BID  . digoxin  125 mcg Oral Daily  . feeding supplement  1 Container Oral TID BM  . ipratropium-albuterol  3 mL Nebulization Q6H WA  . methylPREDNISolone (SOLU-MEDROL) injection  60 mg Intravenous Q6H  . multivitamin with minerals  1 tablet Oral Daily  . tamsulosin  0.4 mg Oral QPM  . traZODone  50  mg Oral 2 times per day   Continuous Infusions: . ceFEPime (MAXIPIME) IV Stopped (06/08/19 0631)  . magnesium sulfate bolus IVPB     PRN Meds:.acetaminophen **OR** acetaminophen, ondansetron **OR** ondansetron (ZOFRAN) IV, oxyCODONE Medications Prior to Admission:  Prior to Admission medications   Medication Sig Start Date End Date Taking? Authorizing Provider  alum & mag hydroxide-simeth (MAALOX/MYLANTA) 200-200-20 MG/5ML suspension Take 15 mLs by mouth daily as needed for indigestion or heartburn.   Yes [provider]  atezolizumab 1,200 mg in sodium chloride 0.9 % 250 mL Inject 1,200 mg into the vein every 21 ( twenty-one) days. 06/03/19  Yes [provider]  calcium carbonate (TUMS - DOSED IN MG ELEMENTAL CALCIUM) 500 MG chewable tablet Chew 2 tablets by mouth 2 (two) times daily as needed for indigestion or heartburn.   Yes [provider]  CARBOplatin in sodium chloride 0.9 % 100 mL Inject into the vein every 21 ( twenty-one) days.   Yes [provider]  celecoxib (CELEBREX) 200 MG capsule Take 200 mg by mouth daily. 04/28/19  Yes [provider]  cetirizine (ZYRTEC) 10 MG tablet Take 10 mg by mouth daily.   Yes [provider]  cholecalciferol (VITAMIN  D3) 25 MCG (1000 UNIT) tablet Take 1,000 Units by mouth daily.   Yes [provider]  digoxin (LANOXIN) 0.125 MG tablet Take 125 mcg by mouth daily. 04/30/19  Yes [provider]  ETOPOSIDE IV Inject 100 mg/m2 into the vein every 21 ( twenty-one) days. 06/14/19  Yes [provider]  lidocaine-prilocaine (EMLA) cream Apply a pea-sized amount to port a cath site and cover with plastic wrap 1 hour prior to chemotherapy appointments 06/03/19  Yes Derek Jack, MD  loperamide (IMODIUM A-D) 2 MG tablet Take 2 mg by mouth 4 (four) times daily as needed for diarrhea or loose stools.   Yes [provider]  metFORMIN (GLUCOPHAGE) 500 MG tablet Take 500 mg by mouth 2 (two) times daily. 04/16/19  Yes [provider]  Multiple Vitamin (MULTIVITAMIN WITH MINERALS) TABS tablet Take 1 tablet by mouth daily.   Yes [provider]  Oxycodone HCl 10 MG TABS Take 1 tablet (10 mg total) by mouth every 4 (four) hours as needed (for pain). 06/01/19  Yes Derek Jack, MD  Polyvinyl Alcohol-Povidone (REFRESH OP) Place 1 drop into both eyes daily as needed (dry eyes).   Yes [provider]  prochlorperazine (COMPAZINE) 10 MG tablet Take 1 tablet (10 mg total) by mouth every 6 (six) hours as needed (Nausea or vomiting). 06/02/19  Yes Derek Jack, MD  tamsulosin (FLOMAX) 0.4 MG CAPS capsule Take 0.4 mg by mouth every evening.  03/18/19  Yes [provider]  traZODone (DESYREL) 50 MG tablet Take 50 mg by mouth See admin instructions. At dinner and bedtime 04/30/19  Yes [provider]   Allergies  Allergen Reactions  . Amoxicillin Hives and Swelling    Did it involve swelling of the face/tongue/throat, SOB, or low BP? Yes Did it involve sudden or severe rash/hives, skin peeling, or any reaction on the inside of your mouth or nose? Yes Did you need to seek medical attention at a hospital or doctor's office? Yes When did it last  happen?20 + years If all above answers are "NO", may proceed with cephalosporin use.   . Codeine Swelling   Review of Systems  Unable to perform ROS: Age    Physical Exam Vitals and nursing note reviewed.  Constitutional:  General: He is not in acute distress.    Appearance: He is ill-appearing.     Comments: Makes and somewhat keeps eye contact  Cardiovascular:     Rate and Rhythm: Normal rate.  Pulmonary:     Effort: Pulmonary effort is normal. No tachypnea, accessory muscle usage or respiratory distress.  Abdominal:     Palpations: Abdomen is soft.  Musculoskeletal:     Right lower leg: No edema.     Left lower leg: No edema.     Comments: Muscle wasting  Skin:    General: Skin is warm and dry.  Neurological:     Mental Status: He is alert and oriented to person, place, and time.  Psychiatric:     Comments: Calm and cooperative     Vital Signs: BP (!) 110/53   Pulse 91   Temp 97.6 F (36.4 C) (Oral)   Resp 16   Ht 5' 6"  (1.676 m)   Wt 61.3 kg   SpO2 96%   BMI 21.81 kg/m  Pain Scale: 0-10 POSS *See Group Information*: 1-Acceptable,Awake and alert Pain Score: 8    SpO2: SpO2: 96 % O2 Device:SpO2: 96 % O2 Flow Rate: .O2 Flow Rate (L/min): 2 L/min  IO: Intake/output summary:   Intake/Output Summary (Last 24 hours) at 06/08/2019 1558 Last data filed at 06/08/2019 0700 Gross per 24 hour  Intake 470 ml  Output 1050 ml  Net -580 ml    LBM: Last BM Date: 06/06/19 Baseline Weight: Weight: 56.9 kg Most recent weight: Weight: 61.3 kg     Palliative Assessment/Data:   Flowsheet Rows     Most Recent Value  Intake Tab  Referral Department  Hospitalist  Unit at Time of Referral  Cardiac/Telemetry Unit  Palliative Care Primary Diagnosis  Cancer  Date Notified  06/08/19  Palliative Care Type  New Palliative care  Date of Admission  06/06/19  Date first seen by Palliative Care  06/08/19  # of days Palliative referral response time  0 Day(s)  #  of days IP prior to Palliative referral  2  Clinical Assessment  Palliative Performance Scale Score  30%  Pain Max last 24 hours  Not able to report  Pain Min Last 24 hours  Not able to report  Dyspnea Max Last 24 Hours  Not able to report  Dyspnea Min Last 24 hours  Not able to report  Psychosocial & Spiritual Assessment  Palliative Care Outcomes      Time In: 1540 Time Out: 1650 Time Total: 70 minutes Greater than 50%  of this time was spent counseling and coordinating care related to the above assessment and plan.  Signed by: Drue Novel, NP   Please contact Palliative Medicine Team phone at [phone] for questions and concerns.  For individual provider: See Shea Evans

## 2019-06-08 NOTE — Progress Notes (Signed)
Patient's wife called me today to notify us that he was in the hospital.  He had difficulty breathing on Saturday so she brought him in for care.  She wanted to talk with me about goals of care. She states that she wants him to live but she also wants him to be comfortable. She asked questions about what would happen if he quit receiving treatments. She asked if there was any need to even continue since one treatment didn't fare him well.  She asked me questions about clarifying his cancer type and clarifying where it was in his body.  I was able to answer her questions to her satisfaction at this moment but she did say that she wanted to talk to her son and then maybe she would call back to talk about palliative care vs continuing treatments.  I advised her to share that also with the doctor that is caring for him in the hospital so that he can arrange a palliative care consult to at least talk to them today.  She is agreeable and said that she would do that.  I advised her to call me back if she has any further questions.  She verbalizes understanding.

## 2019-06-08 NOTE — Telephone Encounter (Signed)
24 hr Chemo F/U : Pt presently admitted to hospital

## 2019-06-08 NOTE — Progress Notes (Addendum)
Initial Nutrition Assessment  DOCUMENTATION CODES:   Severe malnutrition in context of chronic illness  INTERVENTION:  D/c Ensure Enlive po BID, pt complains they make him feel bloated.  Trial -Boost Breeze po TID, each supplement provides 250 kcal and 9 grams of protein   Regular diet - snacks as desired   NUTRITION DIAGNOSIS:   Severe Malnutrition related to chronic illness(Small cell lung cancer -metastasis to spine, liver and ribs.) as evidenced by energy intake < or equal to 75% for > or equal to 1 month, severe fat depletion, moderate fat depletion, moderate muscle depletion, severe muscle depletion, percent weight loss- (7%) x 1 month.   GOAL:  (Based on patient health care decisions)    MONITOR:  PO intake, Supplement acceptance, Labs, Weight trends, Skin    REASON FOR ASSESSMENT:   Malnutrition Screening Tool    ASSESSMENT: Patient is an 86 yo male from home. PMH: DM, BPH, arthritis. Newly diagnosed with small cell lung cancer in early March metastisis to multiple areas (spine, liver, ribs). Receiving chemotherapy  -last treatment 06/05/19. Patient presents with shortness of breath-acute respiratory failure with hypoxia.    Goals of care discussions and decisions in process. His wife at bedside.   Patient says he lost his appetite prior to his cancer diagnosis. This correlates with his significant wt loss noted below.   Meals: 0-50% - able to feed himself. Patient receiving Ensure but complains of bloating. He is willing to trial the Boost Breeze which may be easier for him to tolerate. Nutrition staff working with him to address his food preferences for each meal.   Medications reviewed and include: MVI, methylPrednisolone  Labs: BMP Latest Ref Rng & Units 06/08/2019 06/07/2019 06/06/2019  Glucose 70 - 99 mg/dL 173(H) 144(H) 114(H)  BUN 8 - 23 mg/dL 17 16 16   Creatinine 0.61 - 1.24 mg/dL 0.44(L) 0.42(L) 0.52(L)  Sodium 135 - 145 mmol/L 136 136 134(L)  Potassium  3.5 - 5.1 mmol/L 4.1 4.3 3.1(L)  Chloride 98 - 111 mmol/L 96(L) 99 96(L)  CO2 22 - 32 mmol/L 31 31 29   Calcium 8.9 - 10.3 mg/dL 8.9 8.4(L) 9.0    Weight 61.3 kg (134.9 lb) - loss of 7% in 1 month.   NUTRITION - FOCUSED PHYSICAL EXAM: Nutrition-Focused physical exam completed. Findings are severe orbital, upper arms and moderate buccal fat depletion, moderate-dorsal and temporal, severe clavicle, acromion bone region muscle depletions, and no edema.    Diet Order:   Diet Order            Diet regular Room service appropriate? Yes; Fluid consistency: Thin  Diet effective now              EDUCATION NEEDS:    Skin:  Skin Assessment: Reviewed RN Assessment  Last BM:  3/13  Height:   Ht Readings from Last 1 Encounters:  06/06/19 5\' 6"  (1.676 m)    Weight:   Wt Readings from Last 1 Encounters:  06/06/19 61.3 kg    Ideal Body Weight:   65 kg  BMI:  Body mass index is 21.81 kg/m.  Estimated Nutritional Needs:   Kcal:  2135-2318  Protein:  90-99 gr protein  Fluid:  >1800 ml daily  Colman Cater MS,RD,CSG,LDN Office: [phone] Pager: # available in [facility]

## 2019-06-08 NOTE — Progress Notes (Signed)
SATURATION QUALIFICATIONS: (This note is used to comply with regulatory documentation for home oxygen)  Patient Saturations on Room Air at Rest = 90%  Patient Saturations on Room Air while Ambulating = 85%  Patient Saturations on 2 Liters of oxygen while Ambulating = 95%  Please briefly explain why patient needs home oxygen:  Patient's O2 saturation drops to 85% when ambulating on room air.

## 2019-06-08 NOTE — TOC Initial Note (Signed)
Transition of Care [facility]) - Initial/Assessment Note    Patient Details  Name: Dustin Shaw MRN: [id number] Date of Birth: [birth year]  [facility]) CM/SW Contact:    Boneta Lucks, RN Phone Number: 06/08/2019, 4:41 PM  Clinical Narrative:     Patient admitted with acute respiratory failure with hypoxia. Patient lives at home with spouse.  PT needs home 02 and hospital bed. Orders have been placed.  Choices given,  Blake Divine with [employer] accepted the referral.  TOC to follow.            Expected Discharge Plan: Home/Self Care Barriers to Discharge: Continued Medical Work up   Patient Goals and CMS Choice Patient states their goals for this hospitalization and ongoing recovery are:: to go home. CMS Medicare.gov Compare Post Acute Care list provided to:: Patient Choice offered to / list presented to : Patient  Expected Discharge Plan and Services Expected Discharge Plan: Home/Self Care       Living arrangements for the past 2 months: Single Family Home                 DME Arranged: Hospital bed, Oxygen DME Agency: [employer] Date DME Agency Contacted: 06/08/19 Time DME Agency Contacted: 1600 Representative spoke with at DME Agency: Blake Divine     Prior Living Arrangements/Services Living arrangements for the past 2 months: [place] with:: Spouse   Do you feel safe going back to the place where you live?: Yes      Need for Family Participation in Patient Care: Yes (Comment) Care giver support system in place?: Yes (comment)   Criminal Activity/Legal Involvement Pertinent to Current Situation/Hospitalization: No - Comment as needed  Activities of Daily Living Home Assistive Devices/Equipment: Cane (specify quad or straight), Raised toilet seat with rails ADL Screening (condition at time of admission) Patient's cognitive ability adequate to safely complete daily activities?: Yes Is the patient deaf or have difficulty hearing?: Yes Does the  patient have difficulty seeing, even when wearing glasses/contacts?: No Does the patient have difficulty concentrating, remembering, or making decisions?: No Patient able to express need for assistance with ADLs?: Yes Does the patient have difficulty dressing or bathing?: Yes Independently performs ADLs?: No Communication: Dependent Is this a change from baseline?: Pre-admission baseline Dressing (OT): Needs assistance Is this a change from baseline?: Pre-admission baseline Grooming: Needs assistance Is this a change from baseline?: Pre-admission baseline Feeding: Independent Bathing: Dependent Is this a change from baseline?: Pre-admission baseline Toileting: Needs assistance Is this a change from baseline?: Pre-admission baseline In/Out Bed: Needs assistance Is this a change from baseline?: Pre-admission baseline Walks in Home: Dependent Is this a change from baseline?: Pre-admission baseline Does the patient have difficulty walking or climbing stairs?: Yes Weakness of Legs: Both Weakness of Arms/Hands: Both  Permission Sought/Granted        Emotional Assessment    Alcohol / Substance Use: Not Applicable Psych Involvement: No (comment)  Admission diagnosis:  Acute respiratory failure with hypoxia ([place]) [J96.01] Patient Active Problem List   Diagnosis Date Noted  . Unspecified atrial fibrillation ([place]) 06/07/2019  . Obstructive pneumonia 06/07/2019  . Acute respiratory failure with hypoxia ([place]) 06/06/2019  . COPD with acute exacerbation ([place]) 06/06/2019  . Small cell lung cancer ([place]) 06/02/2019  . Goals of care, counseling/discussion 06/02/2019  . Metastatic lung cancer (metastasis from lung to other site), right ([place]) 05/15/2019   PCP:  Wannetta Sender, FNP Pharmacy:   [place], [place] -  [address] [place] [address] Phone: [phone] Fax: [phone]

## 2019-06-09 ENCOUNTER — Other Ambulatory Visit (HOSPITAL_COMMUNITY): Payer: Medicare HMO | Admitting: General Practice

## 2019-06-09 ENCOUNTER — Inpatient Hospital Stay (HOSPITAL_COMMUNITY): Payer: Medicare HMO

## 2019-06-09 ENCOUNTER — Ambulatory Visit: Payer: Medicare HMO | Admitting: General Surgery

## 2019-06-09 DIAGNOSIS — I351 Nonrheumatic aortic (valve) insufficiency: Secondary | ICD-10-CM

## 2019-06-09 DIAGNOSIS — E43 Unspecified severe protein-calorie malnutrition: Secondary | ICD-10-CM

## 2019-06-09 LAB — BASIC METABOLIC PANEL
Anion gap: 9 (ref 5–15)
BUN: 16 mg/dL (ref 8–23)
CO2: 30 mmol/L (ref 22–32)
Calcium: 8.6 mg/dL — ABNORMAL LOW (ref 8.9–10.3)
Chloride: 96 mmol/L — ABNORMAL LOW (ref 98–111)
Creatinine, Ser: 0.38 mg/dL — ABNORMAL LOW (ref 0.61–1.24)
GFR calc Af Amer: >60 mL/min (ref >60)
GFR calc non Af Amer: >60 mL/min (ref >60)
Glucose, Bld: 197 mg/dL — ABNORMAL HIGH (ref 70–99)
Potassium: 3.9 mmol/L (ref 3.5–5.1)
Sodium: 135 mmol/L (ref 135–145)

## 2019-06-09 LAB — ECHOCARDIOGRAM COMPLETE
Height: 66 in
Weight: 2162.27 oz

## 2019-06-09 LAB — TSH: TSH: 0.158 u[IU]/mL — ABNORMAL LOW (ref 0.350–4.500)

## 2019-06-09 LAB — MAGNESIUM: Magnesium: 1.9 mg/dL (ref 1.7–2.4)

## 2019-06-09 LAB — T4, FREE: Free T4: 1.07 ng/dL (ref 0.61–1.12)

## 2019-06-09 MED ORDER — PREDNISONE 20 MG PO TABS: 60.0000 mg | ORAL_TABLET | Freq: Every day | ORAL | Status: DC

## 2019-06-09 MED ORDER — PREDNISONE 10 MG PO TABS: 60.0000 mg | ORAL_TABLET | Freq: Every day | ORAL | 0 refills | Status: AC

## 2019-06-09 MED ORDER — LEVOFLOXACIN 500 MG PO TABS: 500.0000 mg | ORAL_TABLET | Freq: Every day | ORAL | 0 refills | Status: AC

## 2019-06-09 MED ORDER — APIXABAN 5 MG PO TABS: 5.0000 mg | ORAL_TABLET | Freq: Two times a day (BID) | ORAL | 1 refills | Status: AC

## 2019-06-09 NOTE — Progress Notes (Signed)
*  PRELIMINARY RESULTS* Echocardiogram 2D Echocardiogram has been performed.  Dustin Shaw 06/09/2019, 1:29 PM

## 2019-06-09 NOTE — Discharge Summary (Signed)
Physician Discharge Summary  Liborio Saccente MGQ:[id number] DOB: [birth year] DOA: 06/06/2019  PCP: Wannetta Sender, FNP  Admit date: 06/06/2019 Discharge date: 06/09/2019  Admitted From: Home Disposition:  Home   Recommendations for Outpatient Follow-up:  1. Follow up with PCP in 1-2 weeks 2. Please obtain BMP/CBC in one week   Home Health: YES Equipment/Devices: HHPT, 2L [place]  Discharge Condition: Stable CODE STATUS: DNR Diet recommendation: regular   Brief/Interim Summary: 86 y.o.malewith medical history ofextensive stage small cell lung cancer, diabetes mellitus type 2, and BPH presenting with 2-day history of shortness of breath. Patient was recently diagnosed with small cell lung cancer in the first week of March after liver biopsy. He was started on atezolizumab, carboplatin, and etoposidewith his last treatment on 06/05/2019. He denied any fevers, chills, chest pain, nausea, vomiting, diarrhea, abdominal pain, dysuria, hematuria, headache. He has a nonproductive cough. He denies any hemoptysis. He denies any hematochezia or melena. He denies any worsening lower extremity edema orthopnea type symptoms. He is not normally on home oxygen. He quit smoking in 1991 after approximately 80-pack-year history. He denies any new medications. Because of worsening shortness of breath presented for further evaluation In the emergency department, the patient was afebrile hemodynamically stable with oxygen saturation 75% on room air. BMP showed potassium 3.1 with serum creatinine 0.52. WBC 11.0, hemoglobin 14.0, platelets 273,000. CT angiogram chest was negative for pulmonary embolus but showed right paratracheal lymph node and subcarinal lymph node. There is also small bilateral pleural effusions, right greater than left. There was a right perihilar mass that increased in size since his previous CT. There was a stable ill-defined masslike density and consolidation in the  right upper lobe with increasing surrounding groundglass opacity and interlobular thickening suggesting lymphangitic spread. There is osseous mets to there is right glenoid and left fourth and fifth ribs.  Discharge Diagnoses:  Acute respiratory failure with hypoxia -Multifactorial including lymphangitic spread from his lung cancer, COPD exacerbation, postobstructive pneumonia -Presently stable on 4 L nasal cannula>>>3L>>2L -Wean oxygen as tolerated for saturation greater 90% -ambulatory pulse ox desaturated < 88%-->set up home oxygen--2l  COPD exacerbation -continue duo nebs -continueIV Solu-Medrol -d/c home with prednisone taper  Postobstructive pneumonia -Continuecefepime>>>home with levofloxacin x 4 more days -Check procalcitonin43.47 -MRSA screen--neg  Atrial Fibrillation, type unspecified -had short episode RVR -now rate controlled -likelypre-established before admission as pt says he's been digoxin >5 years -personally reviewed EKG--afib, nonspecific ST changes -TSH--0.445 -Echo--pending at time of d/c -CHADS-VASc--4 (Age, DM, ASVD) -started apixaban  Extensive stage small cell lung cancer -Patient has metastasis to the vertebrae, ribs, glenoid, and liver -Last chemotherapy 06/05/2019 -CTA chest suggest lymphangitic spread -Follow-up Dr. Delton Coombes  Diabetes mellitus type 2 -Allow for liberal glycemic control at this point  Hypokalemia/Hypomagnesemia -Repleted  Neoplastic associated pain -Continue home dose oxycodone  Severe Protein Calorie Malnutrition -continue supplements  Goals of care discussion -Discuss goals of care with spouse and patient-->confirms DNR -palliative medicine consulted and had [place] discussion     Discharge Instructions   Allergies as of 06/09/2019      Reactions   Amoxicillin Hives, Swelling   Did it involve swelling of the face/tongue/throat, SOB, or low BP? Yes Did it involve sudden or severe rash/hives, skin  peeling, or any reaction on the inside of your mouth or nose? Yes Did you need to seek medical attention at a hospital or doctor's office? Yes When did it last happen?20 + years If all above answers are "NO", may proceed with  cephalosporin use.   Codeine Swelling      Medication List    TAKE these medications   alum & mag hydroxide-simeth 200-200-20 MG/5ML suspension Commonly known as: MAALOX/MYLANTA Take 15 mLs by mouth daily as needed for indigestion or heartburn.   apixaban 5 MG Tabs tablet Commonly known as: ELIQUIS Take 1 tablet (5 mg total) by mouth 2 (two) times daily.   atezolizumab 1,200 mg in sodium chloride 0.9 % 250 mL Inject 1,200 mg into the vein every 21 ( twenty-one) days.   calcium carbonate 500 MG chewable tablet Commonly known as: TUMS - dosed in mg elemental calcium Chew 2 tablets by mouth 2 (two) times daily as needed for indigestion or heartburn.   CARBOplatin in sodium chloride 0.9 % 100 mL Inject into the vein every 21 ( twenty-one) days.   celecoxib 200 MG capsule Commonly known as: CELEBREX Take 200 mg by mouth daily.   cetirizine 10 MG tablet Commonly known as: ZYRTEC Take 10 mg by mouth daily.   cholecalciferol 25 MCG (1000 UNIT) tablet Commonly known as: VITAMIN D3 Take 1,000 Units by mouth daily.   digoxin 0.125 MG tablet Commonly known as: LANOXIN Take 125 mcg by mouth daily.   ETOPOSIDE IV Inject 100 mg/m2 into the vein every 21 ( twenty-one) days. Start taking on: June 14, 2019   levofloxacin 500 MG tablet Commonly known as: LEVAQUIN Take 1 tablet (500 mg total) by mouth daily.   lidocaine-prilocaine cream Commonly known as: EMLA Apply a pea-sized amount to port a cath site and cover with plastic wrap 1 hour prior to chemotherapy appointments   loperamide 2 MG tablet Commonly known as: IMODIUM A-D Take 2 mg by mouth 4 (four) times daily as needed for diarrhea or loose stools.   metFORMIN 500 MG tablet Commonly known  as: GLUCOPHAGE Take 500 mg by mouth 2 (two) times daily.   multivitamin with minerals Tabs tablet Take 1 tablet by mouth daily.   Oxycodone HCl 10 MG Tabs Take 1 tablet (10 mg total) by mouth every 4 (four) hours as needed (for pain).   predniSONE 10 MG tablet Commonly known as: DELTASONE Take 6 tablets (60 mg total) by mouth daily with breakfast. And decrease by one tablet daily Start taking on: June 10, 2019   prochlorperazine 10 MG tablet Commonly known as: COMPAZINE Take 1 tablet (10 mg total) by mouth every 6 (six) hours as needed (Nausea or vomiting).   REFRESH OP Place 1 drop into both eyes daily as needed (dry eyes).   tamsulosin 0.4 MG Caps capsule Commonly known as: FLOMAX Take 0.4 mg by mouth every evening.   traZODone 50 MG tablet Commonly known as: DESYREL Take 50 mg by mouth See admin instructions. At dinner and bedtime            Durable Medical Equipment  (From admission, onward)         Start     Ordered   06/08/19 1557  For home use only DME oxygen  Once    Question Answer Comment  Length of Need Lifetime   Mode or (Route) Nasal cannula   Liters per Minute 2   Frequency Continuous (stationary and portable oxygen unit needed)   Oxygen conserving device Yes   Oxygen delivery system Gas      06/08/19 1557   06/08/19 1557  For home use only DME Hospital bed  Once    Question Answer Comment  Length of Need Lifetime  Patient has (list medical condition): extensive stage small cell lung cancer, COPD, deconditioning   The above medical condition requires: Patient requires the ability to reposition frequently   Head must be elevated greater than: 30 degrees   Bed type Semi-electric      06/08/19 1557         Follow-up Information    [facility] Follow up.   Why:   Home oxygen and hospital bed         Allergies  Allergen Reactions  . Amoxicillin Hives and Swelling    Did it involve swelling of the face/tongue/throat, SOB, or low  BP? Yes Did it involve sudden or severe rash/hives, skin peeling, or any reaction on the inside of your mouth or nose? Yes Did you need to seek medical attention at a hospital or doctor's office? Yes When did it last happen?20 + years If all above answers are "NO", may proceed with cephalosporin use.   . Codeine Swelling    Consultations:  Palliative medicine   Procedures/Studies: CT Angio Chest PE W and/or Wo Contrast  Result Date: 06/06/2019 CLINICAL DATA:  Dyspnea for 2 days. Recent diagnosis of small cell lung cancer, initiated chemotherapy this week. EXAM: CT ANGIOGRAPHY CHEST WITH CONTRAST TECHNIQUE: Multidetector CT imaging of the chest was performed using the standard protocol during bolus administration of intravenous contrast. Multiplanar CT image reconstructions and MIPs were obtained to evaluate the vascular anatomy. CONTRAST:  140mL OMNIPAQUE IOHEXOL 350 MG/ML SOLN COMPARISON:  05/10/2019 chest CT angiogram. FINDINGS: Cardiovascular: The study is high quality for the evaluation of pulmonary embolism. There are no filling defects in the central, lobar, segmental or subsegmental pulmonary artery branches to suggest acute pulmonary embolism. Atherosclerotic nonaneurysmal thoracic aorta. Normal caliber main pulmonary artery. Top-normal heart size. Trace pericardial effusion/thickening is unchanged. Left anterior descending coronary atherosclerosis. Mediastinum/Nodes: Heterogeneous thyroid gland without discrete thyroid nodules. Unremarkable esophagus. No axillary adenopathy. Right paratracheal adenopathy up to 2.9 cm short axis diameter (series 8/image 30), stable using similar measurement technique. Enlarged 2.0 cm subcarinal node (series 8/image 52), stable. No left hilar adenopathy. Lungs/Pleura: No pneumothorax. Small dependent pleural effusions bilaterally, right greater than left, increased. Moderate centrilobular and paraseptal emphysema. Poorly marginated right perihilar 7.6 x  4.9 cm lung mass (series 9/image 150), increased from 6.3 x 3.9 cm. Stable ill-defined masslike consolidation throughout the dependent peripheral right upper lobe measuring up to 8.7 cm (series 10/image 63), with slightly increased surrounding patchy ground-glass opacity and interlobular septal thickening. Worsening passive atelectasis in the dependent lower lobes bilaterally, right greater than left. Upper abdomen: Hypodense ill-defined 2.8 cm right liver dome mass (series 8/image 89), increased from 2.1 cm. Cholecystectomy. Hypodense 2.3 cm left adrenal nodule, stable. Musculoskeletal: Lytic expansile metastases in the right glenoid and left anterolateral fourth and fifth ribs have mildly increased. Poorly marginated lytic osseous metastases in the T8 and T9 vertebral bodies and L1 vertebral body appears similar. Marked thoracic spondylosis. Review of the MIP images confirms the above findings. IMPRESSION: 1. No pulmonary embolism. 2. Small dependent pleural effusions bilaterally, right greater than left, increased. 3. Poorly marginated right perihilar lung mass is increased, compatible with known small cell malignancy. Stable ill-defined masslike consolidation throughout the dependent peripheral right upper lobe with slightly increased surrounding patchy ground-glass opacity and interlobular septal thickening, suggesting worsening lymphangitic tumor. 4. Stable infiltrative bulky mediastinal lymphadenopathy. 5. Interval growth of right liver dome metastasis. 6. Mildly enlarging lytic expansile osseous metastases in the right glenoid and left anterolateral fourth and  fifth ribs. Stable ill-defined lytic osseous metastases in the T8, T9 and L1 vertebral bodies. 7. Stable indeterminate left adrenal lesion, metastasis not excluded. 8. Aortic Atherosclerosis (ICD10-I70.0) and Emphysema (ICD10-J43.9). Electronically Signed   By: Ilona Sorrel M.D.   On: 06/06/2019 12:29   CT Angio Chest PE W/Cm &/Or Wo Cm  Result  Date: 05/10/2019 CLINICAL DATA:  Short of breath, generalized body pain EXAM: CT ANGIOGRAPHY CHEST WITH CONTRAST TECHNIQUE: Multidetector CT imaging of the chest was performed using the standard protocol during bolus administration of intravenous contrast. Multiplanar CT image reconstructions and MIPs were obtained to evaluate the vascular anatomy. CONTRAST:  143mL OMNIPAQUE IOHEXOL 350 MG/ML SOLN COMPARISON:  05/10/2019 FINDINGS: Cardiovascular: This is a technically adequate evaluation of the pulmonary vasculature. There are no filling defects or pulmonary emboli. There is trace pericardial effusion. The heart is not enlarged. Mild to moderate atherosclerosis of the aorta and coronary vessels. Mediastinum/Nodes: There is diffuse lymphadenopathy throughout the mediastinum and hila. Precarinal adenopathy measures up to 3.1 cm on image 43, subcarinal adenopathy measures up to 2.5 cm reference image 54, enlarged right hilar mass/adenopathy measures up to 4.1 cm reference image 47. Overall, the findings are consistent with bronchogenic right upper lobe neoplasm and mediastinal and ipsilateral hilar metastatic adenopathy. Lungs/Pleura: There is masslike consolidation within the right upper lobe, measuring 7.5 x 7.0 cm reference image 46. It is unclear whether this reflects underlying neoplasm or is postobstructive in nature, as there is abrupt cut off of the right upper lobe bronchus on image 47 adjacent to the right hilar mass described previously. PET-CT is recommended for further evaluation. Diffuse interlobular septal thickening throughout the right upper lobe is suspicious for lymphangitic spread of disease. Bibasilar interstitial prominence likely reflects scarring and fibrosis. No effusion or pneumothorax. There is severe background emphysema. Upper Abdomen: Metastatic disease is seen within the liver. Please refer to corresponding CT abdomen report. Musculoskeletal: Lytic metastatic lesions are seen within the  left anterior fourth rib, left anterolateral fifth rib and right posterior eleventh rib at the costovertebral junction. No pathologic fractures. There is relative lucency within the vertebral bodies from T8 through T12, metastatic disease not excluded. Lytic lesion within the superior aspect of the L1 vertebral body most likely reflects additional metastatic focus. Review of the MIP images confirms the above findings. IMPRESSION: 1. Masslike consolidation in the right upper lobe as well as a right hilar mass resulting in right upper lobe bronchial obstruction, consistent with bronchogenic malignancy. 2. Massive mediastinal and right hilar adenopathy consistent with nodal metastases. 3. Multifocal bony metastatic disease as above. 4. Interstitial prominence throughout the right upper lobe highly suspicious for lymphangitic spread of disease. 5. No evidence of pulmonary embolus. 6. Aortic Atherosclerosis (ICD10-I70.0) and Emphysema (ICD10-J43.9). Electronically Signed   By: Randa Ngo M.D.   On: 05/10/2019 15:49   MR BRAIN W WO CONTRAST  Result Date: 05/27/2019 CLINICAL DATA:  Metastatic lung cancer EXAM: MRI HEAD WITHOUT AND WITH CONTRAST TECHNIQUE: Multiplanar, multiecho pulse sequences of the brain and surrounding structures were obtained without and with intravenous contrast. CONTRAST:  60mL GADAVIST GADOBUTROL 1 MMOL/ML IV SOLN COMPARISON:  None. FINDINGS: Brain: No acute infarction, hemorrhage, hydrocephalus, extra-axial collection or mass lesion. Scattered foci of T2 hyperintensity are seen within the white matter of the cerebral hemispheres, nonspecific, most likely related to chronic small vessel ischemia. There is mild prominence of the supratentorial ventricles and cerebral sulci reflecting parenchymal volume loss, age-appropriate. No focus of abnormal contrast enhancement seen to suggest  metastatic disease to the brain. Vascular: Normal flow voids. Skull and upper cervical spine: Limited views of the  cervical spine shows involvement of the C5 vertebral body with retropulsion of the posterior wall with mild encroachment on the spinal cord at this level. Sinuses/Orbits: Bilateral lens surgery. Visualized paranasal sinuses are clear. Other: None. IMPRESSION: 1. No evidence of intracranial metastatic disease. 2. Mild-to-moderate chronic white matter ischemic changes. 3. C5 vertebral body metastatic involvement with retropulsion of the posterior wall with mild encroachment on the spinal cord. Electronically Signed   By: Pedro Earls M.D.   On: 05/27/2019 09:45   MR Thoracic Spine W Wo Contrast  Result Date: 05/27/2019 CLINICAL DATA:  Metastatic lung cancer. EXAM: MRI THORACIC WITHOUT AND WITH CONTRAST TECHNIQUE: Multiplanar and multiecho pulse sequences of the thoracic spine were obtained without and with intravenous contrast. CONTRAST:  72mL GADAVIST GADOBUTROL 1 MMOL/ML IV SOLN COMPARISON:  CT chest May 10, 2019 FINDINGS: MRI THORACIC SPINE FINDINGS Alignment:  Physiologic. Vertebrae: T1 hypointense, T2 hyperintense enhancing lesions are seen scattered throughout the thoracic spine, consistent with metastatic disease. The largest lesions involve the body, left pedicle and posterior elements at T1, body, right pedicle and posterior elements at T3, anterior aspect of the T9 through T11 vertebral bodies, right costovertebral junction at T11 and body and right pedicle of L1. The L1 lesion involves the posterior wall with mild retropulsion into the spinal canal causing at least mild spinal canal narrowing. This is only partially evaluated in the current study. No significant retropulsion or involvement of the posterior wall at the thoracic level. Localizer images also show C5 and C7 vertebral body lesions. Although limited images were obtained, there appears to be at least mild encroachment on the spinal canal at the C5 level. Cord:  Normal signal and morphology. Paraspinal and other soft  tissues: Mediastinal, right hilar and lung masses are better described on recent CT angiogram of the chest. Small right pleural effusion. Disc levels: T1-2: Mass lesion previously described obliterate the left neural foramen. There is small on extension into the left lateral epidural fat. No significant compression on the thecal sac. T2-3: Posterior disc protrusion. No significant spinal canal or neural foraminal stenosis. T3-4 through T12-L1: No spinal canal or neural foraminal stenosis. L1-2: Partially visualized. There appears to be a least mild spinal canal stenosis and moderate right neural foraminal narrowing. IMPRESSION: 1. Diffuse osseous metastatic disease involving the thoracic spine as above. The largest lesions are seen at T1, T3, T9, T11, and L1. There is mild retropulsion into the spinal canal at L1 only partially evaluated, causing at least mild spinal canal narrowing. 2. Mass lesion obliterates the left T1-2 neural foramen. There is small extension into the left lateral epidural fat without significant compression of the thecal sac. 3. Small right pleural effusion. 4. Mediastinal, right hilar and lung masses are better described on recent CT angiogram of the chest. 5. Limited views of the cervical spine shows C5 and C7 bone metastases with involvement of the posterior wall in at least mild retropulsion at C5. Electronically Signed   By: Pedro Earls M.D.   On: 05/27/2019 09:38   CT Abdomen Pelvis W Contrast  Result Date: 05/10/2019 CLINICAL DATA:  Generalized body pain, abnormal chest x-ray EXAM: CT ABDOMEN AND PELVIS WITH CONTRAST TECHNIQUE: Multidetector CT imaging of the abdomen and pelvis was performed using the standard protocol following bolus administration of intravenous contrast. CONTRAST:  158mL OMNIPAQUE IOHEXOL 350 MG/ML SOLN COMPARISON:  05/10/2019, 12/17/2018 FINDINGS: Lower chest: Please refer to findings within the CT chest report describing bronchogenic  malignancy and intrathoracic metastatic disease. Hepatobiliary: There are multiple liver hypodensities consistent with metastatic disease, largest in the right lobe reference image 10 measuring 1.8 x 1.9 cm. Gallbladder surgically absent. Pancreas: Unremarkable. No pancreatic ductal dilatation or surrounding inflammatory changes. Spleen: Normal in size without focal abnormality. Adrenals/Urinary Tract: Left adrenal thickening measures 16 mm, concerning for metastatic disease given chest CT findings. Right adrenals unremarkable. The kidneys enhance normally and symmetrically. Normal excretion of contrast into unremarkable bilateral pelvocaliceal structures. Bladder is unremarkable. Stomach/Bowel: No bowel obstruction or ileus. No inflammatory changes. Vascular/Lymphatic: Aortic atherosclerosis. No enlarged abdominal or pelvic lymph nodes. Reproductive: Prostate is unremarkable. Other: No abdominal wall hernia or abnormality. No abdominopelvic ascites. Musculoskeletal: Lytic metastatic lesions are seen within the right superior aspect of the L1 vertebral body as well as the inferior aspect of the L5 vertebral body. I do not see any pathologic fracture. Reconstructed images demonstrate no additional findings. IMPRESSION: 1. Metastatic disease within the liver, left adrenal gland, and lumbar spine. Please refer to CT chest report describing primary lung cancer and intrathoracic metastases. Electronically Signed   By: Randa Ngo M.D.   On: 05/10/2019 15:53   US Abdomen Limited  Result Date: 05/27/2019 CLINICAL DATA:  86 year old male with suspected lung cancer metastatic to the liver. Assess for visibility of small liver lesions prior to planned ultrasound-guided biopsy. EXAM: ULTRASOUND ABDOMEN LIMITED RIGHT UPPER QUADRANT COMPARISON:  None. FINDINGS: Gallbladder: Surgically absent Common bile duct: Diameter: Normal at 6 mm Liver: Three solid echogenic lesions are identified in the liver. The largest in the  superior aspect of the right hemi liver measures 2.6 x 2.8 x 3.5 cm. There is a smaller adjacent satellite nodule slightly more centrally within the liver which measures 0.9 x 1.1 x 1.6 cm. More inferiorly in the right lower lobe in the medial subcapsular region is another 1.2 x 1.1 x 1.2 cm lesion. No biliary ductal dilatation. Other: None. IMPRESSION: There are at least 3 solid slightly echogenic lesions scattered throughout the right hemi liver concerning for metastatic disease. At least 1 of the lesions should be amenable to ultrasound-guided core biopsy. Electronically Signed   By: Jacqulynn Cadet M.D.   On: 05/27/2019 13:23   DG Chest Port 1 View  Result Date: 06/06/2019 CLINICAL DATA:  Short of breath. History of metastatic lung carcinoma. EXAM: PORTABLE CHEST 1 VIEW COMPARISON:  05/10/2019, chest radiograph and chest CT. FINDINGS: Dense consolidation in the right upper lobe extends from the right hilum, bordering on the minor fissure. Interstitial and hazy airspace lung opacities are noted elsewhere throughout the right lung. There are prominent interstitial markings in the left lung most evident at the base. No left lung consolidation. No convincing pleural effusion and no pneumothorax. There are destructive lesions of the left lateral fourth and fifth ribs. IMPRESSION: 1. Interval worsening. Opacity extending from the right hilum is more confluent, and there is now extensive ground-glass opacity and interstitial opacities throughout the remainder of the right lung, which could reflect superimposed pneumonia, postobstructive changes or a combination. 2. Lytic left-sided rib lesions have become more apparent than on the prior portable chest radiograph. Electronically Signed   By: Lajean Manes M.D.   On: 06/06/2019 10:02   [place] CORE BIOPSY (LIVER)  Result Date: 05/27/2019 INDICATION: 86 year old male with liver lesions and a right upper lobe mass concerning for lung cancer metastatic to the liver.  Presents  for ultrasound-guided core biopsy to confirm tissue diagnosis. EXAM: ULTRASOUND BIOPSY CORE LIVER MEDICATIONS: None. ANESTHESIA/SEDATION: Moderate (conscious) sedation was employed during this procedure. A total of Versed 1 mg and Fentanyl 50 mcg was administered intravenously. Moderate Sedation Time: 14 minutes. The patient's level of consciousness and vital signs were monitored continuously by radiology nursing throughout the procedure under my direct supervision. FLUOROSCOPY TIME:  None). COMPLICATIONS: None immediate. PROCEDURE: Informed written consent was obtained from the patient after a thorough discussion of the procedural risks, benefits and alternatives. All questions were addressed. Maximal Sterile Barrier Technique was utilized including caps, mask, sterile gowns, sterile gloves, sterile drape, hand hygiene and skin antiseptic. A timeout was performed prior to the initiation of the procedure. The liver was interrogated with ultrasound. The 1.1 cm lesion in the subcapsular medial aspect of segment 6 was identified as the most accessible biopsy target. The overlying skin was marked and then sterilely prepped and draped in the standard fashion using chlorhexidine skin prep. Local anesthesia was attained by infiltration with 1% lidocaine. A small dermatotomy was made. Under real-time ultrasound guidance, a 17 gauge introducer needle was advanced and positioned at the margin of the lesion. Multiple 18 gauge core biopsies were then obtained coaxially using the bio Pince automated biopsy device. Biopsy specimens were placed in formalin and delivered to pathology for further analysis. As the introducer needle was removed, the biopsy tract was embolized with a Gel-Foam slurry. Post biopsy ultrasound imaging demonstrates no evidence of bleeding or complication. IMPRESSION: Technically successful ultrasound-guided core biopsy of liver lesion. Electronically Signed   By: Jacqulynn Cadet M.D.   On:  05/27/2019 15:32         Discharge Exam: Vitals:   06/09/19 0932 06/09/19 1335  BP:    Pulse:    Resp:    Temp:    SpO2: 97% 96%   Vitals:   06/08/19 2224 06/09/19 0509 06/09/19 0932 06/09/19 1335  BP: (!) 113/46 121/82    Pulse: (!) 103 71    Resp: 18 18    Temp: 98.1 F (36.7 C) 97.8 F (36.6 C)    TempSrc: Oral Oral    SpO2: 97% 96% 97% 96%  Weight:      Height:        General: Pt is alert, awake, not in acute distress Cardiovascular: RRR, S1/S2 +, no rubs, no gallops Respiratory: CTA bilaterally, no wheezing, no rhonchi Abdominal: Soft, NT, ND, bowel sounds + Extremities: no edema, no cyanosis   The results of significant diagnostics from this hospitalization (including imaging, microbiology, ancillary and laboratory) are listed below for reference.    Significant Diagnostic Studies: CT Angio Chest PE W and/or Wo Contrast  Result Date: 06/06/2019 CLINICAL DATA:  Dyspnea for 2 days. Recent diagnosis of small cell lung cancer, initiated chemotherapy this week. EXAM: CT ANGIOGRAPHY CHEST WITH CONTRAST TECHNIQUE: Multidetector CT imaging of the chest was performed using the standard protocol during bolus administration of intravenous contrast. Multiplanar CT image reconstructions and MIPs were obtained to evaluate the vascular anatomy. CONTRAST:  174mL OMNIPAQUE IOHEXOL 350 MG/ML SOLN COMPARISON:  05/10/2019 chest CT angiogram. FINDINGS: Cardiovascular: The study is high quality for the evaluation of pulmonary embolism. There are no filling defects in the central, lobar, segmental or subsegmental pulmonary artery branches to suggest acute pulmonary embolism. Atherosclerotic nonaneurysmal thoracic aorta. Normal caliber main pulmonary artery. Top-normal heart size. Trace pericardial effusion/thickening is unchanged. Left anterior descending coronary atherosclerosis. Mediastinum/Nodes: Heterogeneous thyroid gland without discrete thyroid nodules. Unremarkable  esophagus. No  axillary adenopathy. Right paratracheal adenopathy up to 2.9 cm short axis diameter (series 8/image 30), stable using similar measurement technique. Enlarged 2.0 cm subcarinal node (series 8/image 52), stable. No left hilar adenopathy. Lungs/Pleura: No pneumothorax. Small dependent pleural effusions bilaterally, right greater than left, increased. Moderate centrilobular and paraseptal emphysema. Poorly marginated right perihilar 7.6 x 4.9 cm lung mass (series 9/image 150), increased from 6.3 x 3.9 cm. Stable ill-defined masslike consolidation throughout the dependent peripheral right upper lobe measuring up to 8.7 cm (series 10/image 63), with slightly increased surrounding patchy ground-glass opacity and interlobular septal thickening. Worsening passive atelectasis in the dependent lower lobes bilaterally, right greater than left. Upper abdomen: Hypodense ill-defined 2.8 cm right liver dome mass (series 8/image 89), increased from 2.1 cm. Cholecystectomy. Hypodense 2.3 cm left adrenal nodule, stable. Musculoskeletal: Lytic expansile metastases in the right glenoid and left anterolateral fourth and fifth ribs have mildly increased. Poorly marginated lytic osseous metastases in the T8 and T9 vertebral bodies and L1 vertebral body appears similar. Marked thoracic spondylosis. Review of the MIP images confirms the above findings. IMPRESSION: 1. No pulmonary embolism. 2. Small dependent pleural effusions bilaterally, right greater than left, increased. 3. Poorly marginated right perihilar lung mass is increased, compatible with known small cell malignancy. Stable ill-defined masslike consolidation throughout the dependent peripheral right upper lobe with slightly increased surrounding patchy ground-glass opacity and interlobular septal thickening, suggesting worsening lymphangitic tumor. 4. Stable infiltrative bulky mediastinal lymphadenopathy. 5. Interval growth of right liver dome metastasis. 6. Mildly enlarging lytic  expansile osseous metastases in the right glenoid and left anterolateral fourth and fifth ribs. Stable ill-defined lytic osseous metastases in the T8, T9 and L1 vertebral bodies. 7. Stable indeterminate left adrenal lesion, metastasis not excluded. 8. Aortic Atherosclerosis (ICD10-I70.0) and Emphysema (ICD10-J43.9). Electronically Signed   By: Ilona Sorrel M.D.   On: 06/06/2019 12:29   CT Angio Chest PE W/Cm &/Or Wo Cm  Result Date: 05/10/2019 CLINICAL DATA:  Short of breath, generalized body pain EXAM: CT ANGIOGRAPHY CHEST WITH CONTRAST TECHNIQUE: Multidetector CT imaging of the chest was performed using the standard protocol during bolus administration of intravenous contrast. Multiplanar CT image reconstructions and MIPs were obtained to evaluate the vascular anatomy. CONTRAST:  118mL OMNIPAQUE IOHEXOL 350 MG/ML SOLN COMPARISON:  05/10/2019 FINDINGS: Cardiovascular: This is a technically adequate evaluation of the pulmonary vasculature. There are no filling defects or pulmonary emboli. There is trace pericardial effusion. The heart is not enlarged. Mild to moderate atherosclerosis of the aorta and coronary vessels. Mediastinum/Nodes: There is diffuse lymphadenopathy throughout the mediastinum and hila. Precarinal adenopathy measures up to 3.1 cm on image 43, subcarinal adenopathy measures up to 2.5 cm reference image 54, enlarged right hilar mass/adenopathy measures up to 4.1 cm reference image 47. Overall, the findings are consistent with bronchogenic right upper lobe neoplasm and mediastinal and ipsilateral hilar metastatic adenopathy. Lungs/Pleura: There is masslike consolidation within the right upper lobe, measuring 7.5 x 7.0 cm reference image 46. It is unclear whether this reflects underlying neoplasm or is postobstructive in nature, as there is abrupt cut off of the right upper lobe bronchus on image 47 adjacent to the right hilar mass described previously. PET-CT is recommended for further  evaluation. Diffuse interlobular septal thickening throughout the right upper lobe is suspicious for lymphangitic spread of disease. Bibasilar interstitial prominence likely reflects scarring and fibrosis. No effusion or pneumothorax. There is severe background emphysema. Upper Abdomen: Metastatic disease is seen within the liver. Please refer to  corresponding CT abdomen report. Musculoskeletal: Lytic metastatic lesions are seen within the left anterior fourth rib, left anterolateral fifth rib and right posterior eleventh rib at the costovertebral junction. No pathologic fractures. There is relative lucency within the vertebral bodies from T8 through T12, metastatic disease not excluded. Lytic lesion within the superior aspect of the L1 vertebral body most likely reflects additional metastatic focus. Review of the MIP images confirms the above findings. IMPRESSION: 1. Masslike consolidation in the right upper lobe as well as a right hilar mass resulting in right upper lobe bronchial obstruction, consistent with bronchogenic malignancy. 2. Massive mediastinal and right hilar adenopathy consistent with nodal metastases. 3. Multifocal bony metastatic disease as above. 4. Interstitial prominence throughout the right upper lobe highly suspicious for lymphangitic spread of disease. 5. No evidence of pulmonary embolus. 6. Aortic Atherosclerosis (ICD10-I70.0) and Emphysema (ICD10-J43.9). Electronically Signed   By: Randa Ngo M.D.   On: 05/10/2019 15:49   MR BRAIN W WO CONTRAST  Result Date: 05/27/2019 CLINICAL DATA:  Metastatic lung cancer EXAM: MRI HEAD WITHOUT AND WITH CONTRAST TECHNIQUE: Multiplanar, multiecho pulse sequences of the brain and surrounding structures were obtained without and with intravenous contrast. CONTRAST:  32mL GADAVIST GADOBUTROL 1 MMOL/ML IV SOLN COMPARISON:  None. FINDINGS: Brain: No acute infarction, hemorrhage, hydrocephalus, extra-axial collection or mass lesion. Scattered foci of T2  hyperintensity are seen within the white matter of the cerebral hemispheres, nonspecific, most likely related to chronic small vessel ischemia. There is mild prominence of the supratentorial ventricles and cerebral sulci reflecting parenchymal volume loss, age-appropriate. No focus of abnormal contrast enhancement seen to suggest metastatic disease to the brain. Vascular: Normal flow voids. Skull and upper cervical spine: Limited views of the cervical spine shows involvement of the C5 vertebral body with retropulsion of the posterior wall with mild encroachment on the spinal cord at this level. Sinuses/Orbits: Bilateral lens surgery. Visualized paranasal sinuses are clear. Other: None. IMPRESSION: 1. No evidence of intracranial metastatic disease. 2. Mild-to-moderate chronic white matter ischemic changes. 3. C5 vertebral body metastatic involvement with retropulsion of the posterior wall with mild encroachment on the spinal cord. Electronically Signed   By: Pedro Earls M.D.   On: 05/27/2019 09:45   MR Thoracic Spine W Wo Contrast  Result Date: 05/27/2019 CLINICAL DATA:  Metastatic lung cancer. EXAM: MRI THORACIC WITHOUT AND WITH CONTRAST TECHNIQUE: Multiplanar and multiecho pulse sequences of the thoracic spine were obtained without and with intravenous contrast. CONTRAST:  78mL GADAVIST GADOBUTROL 1 MMOL/ML IV SOLN COMPARISON:  CT chest May 10, 2019 FINDINGS: MRI THORACIC SPINE FINDINGS Alignment:  Physiologic. Vertebrae: T1 hypointense, T2 hyperintense enhancing lesions are seen scattered throughout the thoracic spine, consistent with metastatic disease. The largest lesions involve the body, left pedicle and posterior elements at T1, body, right pedicle and posterior elements at T3, anterior aspect of the T9 through T11 vertebral bodies, right costovertebral junction at T11 and body and right pedicle of L1. The L1 lesion involves the posterior wall with mild retropulsion into the spinal  canal causing at least mild spinal canal narrowing. This is only partially evaluated in the current study. No significant retropulsion or involvement of the posterior wall at the thoracic level. Localizer images also show C5 and C7 vertebral body lesions. Although limited images were obtained, there appears to be at least mild encroachment on the spinal canal at the C5 level. Cord:  Normal signal and morphology. Paraspinal and other soft tissues: Mediastinal, right hilar and lung masses are  better described on recent CT angiogram of the chest. Small right pleural effusion. Disc levels: T1-2: Mass lesion previously described obliterate the left neural foramen. There is small on extension into the left lateral epidural fat. No significant compression on the thecal sac. T2-3: Posterior disc protrusion. No significant spinal canal or neural foraminal stenosis. T3-4 through T12-L1: No spinal canal or neural foraminal stenosis. L1-2: Partially visualized. There appears to be a least mild spinal canal stenosis and moderate right neural foraminal narrowing. IMPRESSION: 1. Diffuse osseous metastatic disease involving the thoracic spine as above. The largest lesions are seen at T1, T3, T9, T11, and L1. There is mild retropulsion into the spinal canal at L1 only partially evaluated, causing at least mild spinal canal narrowing. 2. Mass lesion obliterates the left T1-2 neural foramen. There is small extension into the left lateral epidural fat without significant compression of the thecal sac. 3. Small right pleural effusion. 4. Mediastinal, right hilar and lung masses are better described on recent CT angiogram of the chest. 5. Limited views of the cervical spine shows C5 and C7 bone metastases with involvement of the posterior wall in at least mild retropulsion at C5. Electronically Signed   By: Pedro Earls M.D.   On: 05/27/2019 09:38   CT Abdomen Pelvis W Contrast  Result Date: 05/10/2019 CLINICAL DATA:   Generalized body pain, abnormal chest x-ray EXAM: CT ABDOMEN AND PELVIS WITH CONTRAST TECHNIQUE: Multidetector CT imaging of the abdomen and pelvis was performed using the standard protocol following bolus administration of intravenous contrast. CONTRAST:  161mL OMNIPAQUE IOHEXOL 350 MG/ML SOLN COMPARISON:  05/10/2019, 12/17/2018 FINDINGS: Lower chest: Please refer to findings within the CT chest report describing bronchogenic malignancy and intrathoracic metastatic disease. Hepatobiliary: There are multiple liver hypodensities consistent with metastatic disease, largest in the right lobe reference image 10 measuring 1.8 x 1.9 cm. Gallbladder surgically absent. Pancreas: Unremarkable. No pancreatic ductal dilatation or surrounding inflammatory changes. Spleen: Normal in size without focal abnormality. Adrenals/Urinary Tract: Left adrenal thickening measures 16 mm, concerning for metastatic disease given chest CT findings. Right adrenals unremarkable. The kidneys enhance normally and symmetrically. Normal excretion of contrast into unremarkable bilateral pelvocaliceal structures. Bladder is unremarkable. Stomach/Bowel: No bowel obstruction or ileus. No inflammatory changes. Vascular/Lymphatic: Aortic atherosclerosis. No enlarged abdominal or pelvic lymph nodes. Reproductive: Prostate is unremarkable. Other: No abdominal wall hernia or abnormality. No abdominopelvic ascites. Musculoskeletal: Lytic metastatic lesions are seen within the right superior aspect of the L1 vertebral body as well as the inferior aspect of the L5 vertebral body. I do not see any pathologic fracture. Reconstructed images demonstrate no additional findings. IMPRESSION: 1. Metastatic disease within the liver, left adrenal gland, and lumbar spine. Please refer to CT chest report describing primary lung cancer and intrathoracic metastases. Electronically Signed   By: Randa Ngo M.D.   On: 05/10/2019 15:53   US Abdomen Limited  Result  Date: 05/27/2019 CLINICAL DATA:  86 year old male with suspected lung cancer metastatic to the liver. Assess for visibility of small liver lesions prior to planned ultrasound-guided biopsy. EXAM: ULTRASOUND ABDOMEN LIMITED RIGHT UPPER QUADRANT COMPARISON:  None. FINDINGS: Gallbladder: Surgically absent Common bile duct: Diameter: Normal at 6 mm Liver: Three solid echogenic lesions are identified in the liver. The largest in the superior aspect of the right hemi liver measures 2.6 x 2.8 x 3.5 cm. There is a smaller adjacent satellite nodule slightly more centrally within the liver which measures 0.9 x 1.1 x 1.6 cm. More inferiorly in  the right lower lobe in the medial subcapsular region is another 1.2 x 1.1 x 1.2 cm lesion. No biliary ductal dilatation. Other: None. IMPRESSION: There are at least 3 solid slightly echogenic lesions scattered throughout the right hemi liver concerning for metastatic disease. At least 1 of the lesions should be amenable to ultrasound-guided core biopsy. Electronically Signed   By: Jacqulynn Cadet M.D.   On: 05/27/2019 13:23   DG Chest Port 1 View  Result Date: 06/06/2019 CLINICAL DATA:  Short of breath. History of metastatic lung carcinoma. EXAM: PORTABLE CHEST 1 VIEW COMPARISON:  05/10/2019, chest radiograph and chest CT. FINDINGS: Dense consolidation in the right upper lobe extends from the right hilum, bordering on the minor fissure. Interstitial and hazy airspace lung opacities are noted elsewhere throughout the right lung. There are prominent interstitial markings in the left lung most evident at the base. No left lung consolidation. No convincing pleural effusion and no pneumothorax. There are destructive lesions of the left lateral fourth and fifth ribs. IMPRESSION: 1. Interval worsening. Opacity extending from the right hilum is more confluent, and there is now extensive ground-glass opacity and interstitial opacities throughout the remainder of the right lung, which could  reflect superimposed pneumonia, postobstructive changes or a combination. 2. Lytic left-sided rib lesions have become more apparent than on the prior portable chest radiograph. Electronically Signed   By: Lajean Manes M.D.   On: 06/06/2019 10:02   [place] CORE BIOPSY (LIVER)  Result Date: 05/27/2019 INDICATION: 86 year old male with liver lesions and a right upper lobe mass concerning for lung cancer metastatic to the liver. Presents for ultrasound-guided core biopsy to confirm tissue diagnosis. EXAM: ULTRASOUND BIOPSY CORE LIVER MEDICATIONS: None. ANESTHESIA/SEDATION: Moderate (conscious) sedation was employed during this procedure. A total of Versed 1 mg and Fentanyl 50 mcg was administered intravenously. Moderate Sedation Time: 14 minutes. The patient's level of consciousness and vital signs were monitored continuously by radiology nursing throughout the procedure under my direct supervision. FLUOROSCOPY TIME:  None). COMPLICATIONS: None immediate. PROCEDURE: Informed written consent was obtained from the patient after a thorough discussion of the procedural risks, benefits and alternatives. All questions were addressed. Maximal Sterile Barrier Technique was utilized including caps, mask, sterile gowns, sterile gloves, sterile drape, hand hygiene and skin antiseptic. A timeout was performed prior to the initiation of the procedure. The liver was interrogated with ultrasound. The 1.1 cm lesion in the subcapsular medial aspect of segment 6 was identified as the most accessible biopsy target. The overlying skin was marked and then sterilely prepped and draped in the standard fashion using chlorhexidine skin prep. Local anesthesia was attained by infiltration with 1% lidocaine. A small dermatotomy was made. Under real-time ultrasound guidance, a 17 gauge introducer needle was advanced and positioned at the margin of the lesion. Multiple 18 gauge core biopsies were then obtained coaxially using the bio Pince automated  biopsy device. Biopsy specimens were placed in formalin and delivered to pathology for further analysis. As the introducer needle was removed, the biopsy tract was embolized with a Gel-Foam slurry. Post biopsy ultrasound imaging demonstrates no evidence of bleeding or complication. IMPRESSION: Technically successful ultrasound-guided core biopsy of liver lesion. Electronically Signed   By: Jacqulynn Cadet M.D.   On: 05/27/2019 15:32     Microbiology: Recent Results (from the past 240 hour(s))  SARS CORONAVIRUS 2 (Jonay Hitchcock 6-24 HRS) Nasopharyngeal Nasopharyngeal Swab     Status: None   Collection Time: 06/06/19 10:42 AM   Specimen: Nasopharyngeal Swab  Result  Value Ref Range Status   SARS Coronavirus 2 NEGATIVE NEGATIVE Final    Comment: (NOTE) SARS-CoV-2 target nucleic acids are NOT DETECTED. The SARS-CoV-2 RNA is generally detectable in upper and lower respiratory specimens during the acute phase of infection. Negative results do not preclude SARS-CoV-2 infection, do not rule out co-infections with other pathogens, and should not be used as the sole basis for treatment or other patient management decisions. Negative results must be combined with clinical observations, patient history, and epidemiological information. The expected result is Negative. Fact Sheet for Patients: SugarRoll.be Fact Sheet for Healthcare Providers: https://www.woods-mathews.com/ This test is not yet approved or cleared by the [place] FDA and  has been authorized for detection and/or diagnosis of SARS-CoV-2 by FDA under an Emergency Use Authorization (EUA). This EUA will remain  in effect (meaning this test can be used) for the duration of the COVID-19 declaration under Section 56 4(b)(1) of the Act, 21 U.S.C. section 360bbb-3(b)(1), unless the authorization is terminated or revoked sooner. Performed at [facility], [place] [address] [place], [place] [address]    MRSA PCR Screening     Status: None   Collection Time: 06/06/19  4:16 PM   Specimen: Nasal Mucosa; Nasopharyngeal  Result Value Ref Range Status   MRSA by PCR NEGATIVE NEGATIVE Final    Comment:        The GeneXpert MRSA Assay (FDA approved for NASAL specimens only), is one component of a comprehensive MRSA colonization surveillance program. It is not intended to diagnose MRSA infection nor to guide or monitor treatment for MRSA infections. Performed at [facility], [address], [place], [place] [address]      Labs: Basic Metabolic Panel: Recent Labs  Lab 06/03/19 [phone] 06/03/19 0906 06/06/19 0940 06/06/19 0940 06/07/19 0540 06/07/19 0540 06/08/19 0523 06/09/19 0545  NA 135  --  134*  --  136  --  136 135  K 3.4*   < > 3.1*   < > 4.3   < > 4.1 3.9  CL 95*  --  96*  --  99  --  96* 96*  CO2 29  --  29  --  31  --  31 30  GLUCOSE 150*  --  114*  --  144*  --  173* 197*  BUN 11  --  16  --  16  --  17 16  CREATININE 0.59*  --  0.52*  --  0.42*  --  0.44* 0.38*  CALCIUM 9.6  --  9.0  --  8.4*  --  8.9 8.6*  MG 1.7  --   --   --  1.5*  --  1.7 1.9   < > = values in this interval not displayed.   Liver Function Tests: Recent Labs  Lab 06/03/19 0906 06/07/19 0540  AST 23 22  ALT 12 14  ALKPHOS 105 77  BILITOT 0.6 0.7  PROT 7.3 5.7*  ALBUMIN 3.1* 2.6*   No results for input(s): LIPASE, AMYLASE in the last 168 hours. No results for input(s): AMMONIA in the last 168 hours. CBC: Recent Labs  Lab 06/03/19 0906 06/06/19 0940 06/07/19 0540  WBC 9.5 11.0* 6.4  NEUTROABS 7.4 9.6*  --   HGB 14.5 14.0 12.0*  HCT 45.6 45.2 38.5*  MCV 90.5 92.1 90.2  PLT 433* 373 261   Cardiac Enzymes: No results for input(s): CKTOTAL, CKMB, CKMBINDEX, TROPONINI in the last 168 hours. BNP: Invalid input(s): POCBNP CBG: Recent  Labs  Lab 06/06/19 1636 06/06/19 2154  GLUCAP 138* 187*    Time coordinating discharge:  36 minutes  Signed:  Orson Eva, DO [facility] Pager: [phone] 06/09/2019, 1:41 PM

## 2019-06-09 NOTE — Plan of Care (Signed)

## 2019-06-09 NOTE — Progress Notes (Signed)
Palliative: Mr. Christianson is lying quietly in bed.  He greets me making and mostly keeping eye contact.  He is alert and oriented, able to make his basic needs known.  His wife of 76 years is at bedside.  Mr. Emanuelson tells me that he expects to discharge home today.  He and his wife talk about equipment needs such as hospital bed, potty chair, oxygen.  We talked about equipment delivery and needs.  Transition of care team notified.  Mr. Helbert shares that he has many children, grandchildren, nieces and nephews who are willing and ready to assist them.  Thankfully he and Mrs. Delatte are open to assistance.  I ask if Mr. Schoolfield has talked with oncology, and he shares that he has not yet talked with oncology about plans for further cancer treatment.  I encourage Mr. and Mrs. Grisby to work with home health providers, cancer center workers to meet all and any needs.  They are asking appropriate questions, and reassured.  We talked about symptom management, pain medications.  Mr. Pelissier tells me that he is satisfied with his pain management regimen at this point.  We talked about possible need for long-acting medications in the future.  Conference with attending, bedside nursing staff, transition of care team related to patient condition, goals of care discussion, equipment needs and discharge.   Plan:   Treat the treatable but no extraordinary measures such as CPR or intubation.  Awaiting oncology consult for decision-making about further cancer treatment.  Would easily qualify for in-home, treat the treatable, hospice care if no further chemotherapy is offered/accepted.     51 minutes Quinn Axe, NP Palliative Medicine Team Team Phone # [phone] Greater than 50% of this time was spent counseling and coordinating care related to the above assessment and plan.

## 2019-06-09 NOTE — TOC Transition Note (Signed)
Transition of Care [facility]) - CM/SW Discharge Note   Patient Details  Name: Dustin Shaw MRN: [id number] Date of Birth: [birth year]  Transition [facility]) CM/SW Contact:  Boneta Lucks, RN Phone Number: 06/09/2019, 2:07 PM   Clinical Narrative:   Patient discharging home today. PT is recommending HHPT. Patient has [employer].  Tim with [facility] accept the referral. TOC spoke with Earlie Server -wife, Hospital bed will be delivered, she is requesting a bed side commode.  [facility] is out of stock and [place] will not accept his insurance.  Discussed with wife they are $30 at [facility]. [facility] is taking 02 to the room. Patient will follow up with Dr. Raliegh Ip.     Final next level of care: [place] Barriers to Discharge: Barriers Resolved   Patient Goals and CMS Choice Patient states their goals for this hospitalization and ongoing recovery are:: to go home. CMS Medicare.gov Compare Post Acute Care list provided to:: Patient Choice offered to / list presented to : Patient  Discharge Placement                Patient to be transferred to facility by: Wife Name of family member notified: Dorothy Patient and family notified of of transfer: 06/09/19  Discharge Plan and Services                DME Arranged: Hospital bed, Oxygen DME Agency: [facility] Date DME Agency Contacted: 06/08/19 Time DME Agency Contacted: 1600 Representative spoke with at [place]: [place]: PT [place]: [facility] ([facility] Date [place]: 06/09/19 Time [place]: 1406 Representative spoke with at [place]: [employer]

## 2019-06-09 NOTE — Plan of Care (Signed)
  Problem: Education: Goal: Knowledge of General Education information will improve Description: Including pain rating scale, medication(s)/side effects and non-pharmacologic comfort measures 06/09/2019 1427 by Zachery Conch, RN Outcome: Adequate for Discharge 06/09/2019 0950 by Zachery Conch, RN Outcome: Progressing   Problem: Health Behavior/Discharge Planning: Goal: Ability to manage health-related needs will improve Outcome: Adequate for Discharge   Problem: Clinical Measurements: Goal: Ability to maintain clinical measurements within normal limits will improve Outcome: Adequate for Discharge Goal: Will remain free from infection Outcome: Adequate for Discharge Goal: Diagnostic test results will improve Outcome: Adequate for Discharge Goal: Respiratory complications will improve Outcome: Adequate for Discharge Goal: Cardiovascular complication will be avoided Outcome: Adequate for Discharge   Problem: Activity: Goal: Risk for activity intolerance will decrease 06/09/2019 1427 by Zachery Conch, RN Outcome: Adequate for Discharge 06/09/2019 0950 by Zachery Conch, RN Outcome: Progressing   Problem: Nutrition: Goal: Adequate nutrition will be maintained 06/09/2019 1427 by Zachery Conch, RN Outcome: Adequate for Discharge 06/09/2019 0950 by Zachery Conch, RN Outcome: Progressing   Problem: Coping: Goal: Level of anxiety will decrease Outcome: Adequate for Discharge   Problem: Elimination: Goal: Will not experience complications related to bowel motility Outcome: Adequate for Discharge Goal: Will not experience complications related to urinary retention Outcome: Adequate for Discharge   Problem: Pain Managment: Goal: General experience of comfort will improve Outcome: Adequate for Discharge   Problem: Safety: Goal: Ability to remain free from injury will improve 06/09/2019 1427 by Zachery Conch, RN Outcome: Adequate for Discharge 06/09/2019 0950 by Zachery Conch,  RN Outcome: Progressing   Problem: Skin Integrity: Goal: Risk for impaired skin integrity will decrease 06/09/2019 1427 by Zachery Conch, RN Outcome: Adequate for Discharge 06/09/2019 0950 by Zachery Conch, RN Outcome: Progressing

## 2019-06-09 NOTE — Evaluation (Signed)
Physical Therapy Evaluation Patient Details Name: Dustin Shaw MRN: [id number] DOB: 1933/02/19 Today's Date: 06/09/2019   History of Present Illness  Donovon Micheletti is a 86 y.o. male with medical history of extensive stage small cell lung cancer, diabetes mellitus type 2, and BPH presenting with 2-day history of shortness of breath.  Patient was recently diagnosed with small cell lung cancer in the first week of March after liver biopsy.  He was started on atezolizumab, carboplatin, and etoposide with his last treatment on 06/05/2019.  He denied any fevers, chills, chest pain, nausea, vomiting, diarrhea, abdominal pain, dysuria, hematuria, headache.  He has a nonproductive cough.  He denies any hemoptysis.  He denies any hematochezia or melena.  He denies any worsening lower extremity edema orthopnea type symptoms.  He is not normally on home oxygen.  He quit smoking in 1991 after approximately 80-pack-year history.  He denies any new medications.  Because of worsening shortness of breath presented for further evaluation     Clinical Impression  Patient limited for functional mobility as stated below secondary to BLE weakness, fatigue and poor activity tolerance. He requires min guard assist for bed mobility and min assist for transitioning to standing with use of RW secondary to impaired LE strength. He ambulates with RW and min guard assist for safety and is most limited by impaired activity tolerance. He is educated on using RW for mobility to reduce the risk of falling. Patient ended session in chair - RN aware.  Patient will benefit from continued physical therapy in hospital and recommended venue below to increase strength, balance, endurance for safe ADLs and gait.     Follow Up Recommendations Home health PT;Supervision - Intermittent;Supervision for mobility/OOB    Equipment Recommendations  None recommended by PT    Recommendations for Other Services       Precautions / Restrictions  Precautions Precautions: Fall Restrictions Weight Bearing Restrictions: No      Mobility  Bed Mobility Overal bed mobility: Needs Assistance Bed Mobility: Supine to Sit     Supine to sit: Min guard     General bed mobility comments: to transition to seated EOB  Transfers Overall transfer level: Needs assistance Equipment used: Rolling walker (2 wheeled) Transfers: Sit to/from [facility] Sit to Stand: Min assist Stand pivot transfers: Min guard       General transfer comment: with use of RW  Ambulation/Gait Ambulation/Gait assistance: Min guard Gait Distance (Feet): 40 Feet Assistive device: Rolling walker (2 wheeled) Gait Pattern/deviations: Step-through pattern;Decreased step length - right;Decreased step length - left;Decreased stride length Gait velocity: decreased   General Gait Details: slow, labored, with RW, assist for safety  Stairs            Wheelchair Mobility    Modified Rankin (Stroke Patients Only)       Balance Overall balance assessment: Needs assistance Sitting-balance support: Feet supported;No upper extremity supported Sitting balance-Leahy Scale: Good Sitting balance - Comments: seated EOB     Standing balance-Leahy Scale: Fair Standing balance comment: using RW                             Pertinent Vitals/Pain Pain Assessment: Faces Faces Pain Scale: Hurts little more Pain Location: back, ribs, shoulders Pain Intervention(s): Limited activity within patient's tolerance;Monitored during session    [place] expects to be discharged to:: Private residence Living Arrangements: Spouse/significant other;Children Available Help at Discharge: Family;Available 24 hours/day Type  of Home: Mobile home Home Access: Ramped entrance     Home Layout: One level Home Equipment: [employer] - 2 wheels;Cane - single point;Bedside commode      Prior Function Level of Independence: Needs  assistance   Gait / Transfers Assistance Needed: Patient states houshold ambulation with [facility]  ADL's / Homemaking Assistance Needed: family assists        Hand Dominance        Extremity/Trunk Assessment   Upper Extremity Assessment Upper Extremity Assessment: Generalized weakness    Lower Extremity Assessment Lower Extremity Assessment: Generalized weakness    Cervical / Trunk Assessment Cervical / Trunk Assessment: Kyphotic  Communication   Communication: No difficulties  Cognition Arousal/Alertness: Awake/alert Behavior During Therapy: WFL for tasks assessed/performed Overall Cognitive Status: Within Functional Limits for tasks assessed                                        General Comments      Exercises     Assessment/Plan    PT Assessment Patient needs continued PT services  PT Problem List Decreased strength;Decreased activity tolerance;Decreased balance;Decreased mobility;Pain       PT Treatment Interventions DME instruction;Balance training;Gait training;Neuromuscular re-education;Functional mobility training;Patient/family education;Therapeutic activities;Therapeutic exercise    PT Goals (Current goals can be found in the Care Plan section)  Acute Rehab PT Goals Patient Stated Goal: Return home with family PT Goal Formulation: With patient Time For Goal Achievement: 06/16/19 Potential to Achieve Goals: Good    Frequency Min 3X/week   Barriers to discharge        Co-evaluation               AM-PAC PT "6 Clicks" Mobility  Outcome Measure Help needed turning from your back to your side while in a flat bed without using bedrails?: None Help needed moving from lying on your back to sitting on the side of a flat bed without using bedrails?: A Little Help needed moving to and from a bed to a chair (including a wheelchair)?: A Little Help needed standing up from a chair using your arms (e.g., wheelchair or bedside chair)?: A  Little Help needed to walk in hospital room?: A Little Help needed climbing 3-5 steps with a railing? : A Lot 6 Click Score: 18    End of Session Equipment Utilized During Treatment: Gait belt;Oxygen Activity Tolerance: Patient tolerated treatment well;Patient limited by fatigue Patient left: with call bell/phone within reach;in chair Nurse Communication: Mobility status PT Visit Diagnosis: Unsteadiness on feet (R26.81);Other abnormalities of gait and mobility (R26.89);Muscle weakness (generalized) (M62.81)    Time: 9735-3299 PT Time Calculation (min) (ACUTE ONLY): 31 min   Charges:   PT Evaluation $PT Eval Low Complexity: 1 Low PT Treatments $Therapeutic Activity: 23-37 mins        9:50 AM, 06/09/19 Mearl Latin PT, DPT Physical Therapist at [facility]

## 2019-06-09 NOTE — Plan of Care (Signed)
  Problem: Acute Rehab PT Goals(only PT should resolve) Goal: Pt Will Go Supine/Side To Sit Outcome: Progressing Flowsheets (Taken 06/09/2019 0952) Pt will go Supine/Side to Sit: with supervision Goal: Pt Will Go Sit To Supine/Side Outcome: Progressing Flowsheets (Taken 06/09/2019 0952) Pt will go Sit to Supine/Side: with supervision Goal: Patient Will Perform Sitting Balance Outcome: Progressing Flowsheets (Taken 06/09/2019 0952) Patient will perform sitting balance: Independently Goal: Patient Will Transfer Sit To/From Stand Outcome: Progressing Flowsheets (Taken 06/09/2019 [phone]) Patient will transfer sit to/from stand: with min guard assist Goal: Pt Will Transfer Bed To Chair/Chair To Bed Outcome: Progressing Flowsheets (Taken 06/09/2019 0952) Pt will Transfer Bed to Chair/Chair to Bed: min guard assist Goal: Pt Will Ambulate Outcome: Progressing Flowsheets (Taken 06/09/2019 0952) Pt will Ambulate:  75 feet  with supervision  with rolling walker  9:54 AM, 06/09/19 Mearl Latin PT, DPT Physical Therapist at [facility]

## 2019-06-11 ENCOUNTER — Inpatient Hospital Stay (HOSPITAL_BASED_OUTPATIENT_CLINIC_OR_DEPARTMENT_OTHER): Payer: Medicare HMO | Admitting: Hematology

## 2019-06-11 ENCOUNTER — Other Ambulatory Visit: Payer: Self-pay

## 2019-06-11 ENCOUNTER — Encounter (HOSPITAL_COMMUNITY): Payer: Self-pay | Admitting: Hematology

## 2019-06-11 ENCOUNTER — Encounter (HOSPITAL_COMMUNITY): Payer: Self-pay | Admitting: Lab

## 2019-06-11 ENCOUNTER — Inpatient Hospital Stay (HOSPITAL_COMMUNITY): Payer: Medicare HMO

## 2019-06-11 DIAGNOSIS — K5903 Drug induced constipation: Secondary | ICD-10-CM

## 2019-06-11 MED ORDER — LACTULOSE 20 GM/30ML PO SOLN: 30.0000 mL | Freq: Every evening | ORAL | 1 refills | Status: AC | PRN

## 2019-06-11 NOTE — Progress Notes (Unsigned)
Referral sent to Hospice..  Records faxed on 3/18

## 2019-06-11 NOTE — Progress Notes (Signed)
Virtual Visit via Telephone Note  I connected with Dustin Shaw on 06/11/19 at 10:00 AM EDT by telephone and verified that I am speaking with the correct person using two identifiers.   I discussed the limitations, risks, security and privacy concerns of performing an evaluation and management service by telephone and the availability of in person appointments. I also discussed with the patient that there may be a patient responsible charge related to this service. The patient expressed understanding and agreed to proceed.   History of Present Illness: He is seen in our clinic for extensive stage small cell lung cancer.  He was recently hospitalized from 06/06/2019 through 06/09/2019 with hypoxia.  He was sent home on oxygen.  He was also found to have an A. fib.  He was sent home on Eliquis 5 mg  twice daily.   Observations/Objective: Patient reports that his pain has been better.  He is taking oxycodone every 4 hours as needed.  Appetite and energy levels are reported as 50%.  Reports that he had constipation and did not have a bowel movement since 06/08/2019.  He has some trouble swallowing solid foods.  He is able to walk 60 yards before getting short of breath.  Assessment and Plan:  1.  Extensive stage small cell lung cancer: -Cycle 1 of carboplatin, etoposide and atezolizumab on 06/03/2019. -He was hospitalized from 06/06/2019 through 06/09/2019 with A. fib and hypoxia. -I had prolonged discussion with him today about further goals of care.  Patient is in a lot of pain to take frequent trips for treatments and follow-ups.  Hence he decided to proceed with hospice care.  I agree with his decision. -I will make a referral to hospice service. -Because of his high risk of falls and the potential for severe thrombocytopenia from chemotherapy, I have recommended discontinuing apixaban at this time.  2.  Constipation: -He does not have any bowel movement since 06/08/2019. -This is partly from  opioids.  Will send a prescription for lactulose 30 mL every 3 hours until bowel movement followed by lactulose 30 mL at bedtime as needed.  3.  Cancer related pain: -He will continue oxycodone 10 mg every 4 hours.   Follow Up Instructions: As needed.   I discussed the assessment and treatment plan with the patient. The patient was provided an opportunity to ask questions and all were answered. The patient agreed with the plan and demonstrated an understanding of the instructions.   The patient was advised to call back or seek an in-person evaluation if the symptoms worsen or if the condition fails to improve as anticipated.  I provided 22 minutes of non-face-to-face time during this encounter.   Derek Jack, MD

## 2019-06-22 ENCOUNTER — Ambulatory Visit (HOSPITAL_COMMUNITY): Payer: Medicare HMO

## 2019-06-22 ENCOUNTER — Other Ambulatory Visit (HOSPITAL_COMMUNITY): Payer: Medicare HMO

## 2019-06-22 ENCOUNTER — Ambulatory Visit (HOSPITAL_COMMUNITY): Payer: Medicare HMO | Admitting: Hematology

## 2019-06-23 ENCOUNTER — Ambulatory Visit (HOSPITAL_COMMUNITY): Payer: Medicare HMO

## 2019-06-23 ENCOUNTER — Other Ambulatory Visit (HOSPITAL_COMMUNITY): Payer: Medicare HMO | Admitting: General Practice

## 2019-06-24 ENCOUNTER — Ambulatory Visit (HOSPITAL_COMMUNITY): Payer: Medicare HMO

## 2019-06-25 ENCOUNTER — Ambulatory Visit (HOSPITAL_COMMUNITY): Payer: Medicare HMO

## 2019-06-29 ENCOUNTER — Other Ambulatory Visit (HOSPITAL_COMMUNITY): Payer: Self-pay | Admitting: Hematology

## 2019-06-29 ENCOUNTER — Other Ambulatory Visit (HOSPITAL_COMMUNITY): Payer: Self-pay | Admitting: Surgery

## 2019-06-29 MED ORDER — OXYCODONE HCL 10 MG PO TABS: 10.0000 mg | ORAL_TABLET | ORAL | 0 refills | Status: DC | PRN

## 2019-06-29 MED ORDER — OXYCODONE HCL 10 MG PO TABS: 10.0000 mg | ORAL_TABLET | ORAL | 0 refills | Status: AC | PRN

## 2019-07-25 DEATH — deceased
